# Patient Record
Sex: Female | Born: 2000 | Race: Black or African American | Hispanic: No | Marital: Single | Smoking: Never smoker
Health system: Southern US, Community
[De-identification: ages and names within clinical notes are randomized; demographics above are authoritative.]

## PROBLEM LIST (undated history)

## (undated) ENCOUNTER — Emergency Department (HOSPITAL_COMMUNITY): Admission: EM | Payer: Medicaid Other | Source: Home / Self Care

## (undated) ENCOUNTER — Inpatient Hospital Stay (HOSPITAL_COMMUNITY): Payer: Self-pay

## (undated) DIAGNOSIS — J353 Hypertrophy of tonsils with hypertrophy of adenoids: Secondary | ICD-10-CM

## (undated) DIAGNOSIS — E669 Obesity, unspecified: Secondary | ICD-10-CM

## (undated) DIAGNOSIS — G4733 Obstructive sleep apnea (adult) (pediatric): Secondary | ICD-10-CM

## (undated) DIAGNOSIS — F909 Attention-deficit hyperactivity disorder, unspecified type: Secondary | ICD-10-CM

## (undated) DIAGNOSIS — R0683 Snoring: Secondary | ICD-10-CM

## (undated) HISTORY — PX: TONSILLECTOMY: SUR1361

---
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
## 2023-03-21 NOTE — L&D Delivery Note (Addendum)
 OB/GYN Faculty Practice Delivery Note  Wendy Harrington is a 23 y.o. G1P0 s/p SVD at [redacted]w[redacted]d. She was admitted for SROM.    ROM: 22h 73m with clear fluid GBS Status: Positive/-- (08/12 1138), treated with PCN x 4 doses Maximum Maternal Temperature: 98.55F   Labor Progress: Initial SVE: 2/90/-1. Her labor was augmented with Pitocin . She then progressed to complete.   Delivery Date/Time: 11/12/23 0223 Delivery: Called to room and patient was complete and pushing. Head delivered LOP. Despite adequate maternal effort, the anterior shoulder was not delivering with typical traction for delivery. No excessive traction was used to facilitate delivery of infant. I attempted to deliver anterior shoulder with axillary sling, however was unable to given maternal soft tissue, which I think was ultimately the cause of difficult delivery. A shoulder dystocia was called -- called to have extra nursing team and NICU team in room for delivery, and make OR staff aware. The head of the bed was laid flat. Nursing team assisted in flexing both knees to [place]. I then attempted anterior axillary sling again and was able to successfully deliver anterior shoulder. Remainder of body delivered easily with single maternal effort. Episiotomy NOT required for delivery. Infant dried and stimulated and was initially cyanotic with poor tone so cord clamped x 2 and cut by father of baby at ~15 seconds. With further stimulation, infant vigorous with good cry. Arterial cord gas was sent and is pending. Cord blood drawn. Placenta delivered spontaneously with gentle cord traction. Fundus firm with massage and Pitocin . Labia, perineum, vagina, and cervix inspected inspected with no lacerations noted.  Baby Weight: 2892g  Placenta: Sent to L&D Complications: Shoulder dystocia, though this was moreso maternal soft tissue dystocia -- total time <1 min Lacerations: None EBL: 115 mL Analgesia: Epidural   Infant:  APGAR (1 MIN): 7   APGAR (5 MINS):  9   Alain Sor, MD OB Fellow, Faculty Practice [facility], Center for [facility]

## 2023-03-27 ENCOUNTER — Inpatient Hospital Stay (HOSPITAL_COMMUNITY)
Admission: AD | Admit: 2023-03-27 | Discharge: 2023-03-27 | Disposition: A | Discharge status: Home / Self Care | Payer: Medicaid Other | Attending: Obstetrics & Gynecology | Admitting: Obstetrics & Gynecology

## 2023-03-27 ENCOUNTER — Other Ambulatory Visit: Payer: Self-pay | Admitting: Family Medicine

## 2023-03-27 ENCOUNTER — Encounter (HOSPITAL_COMMUNITY): Payer: Self-pay | Admitting: *Deleted

## 2023-03-27 DIAGNOSIS — N926 Irregular menstruation, unspecified: Secondary | ICD-10-CM | POA: Insufficient documentation

## 2023-03-27 NOTE — Discharge Instructions (Signed)
 Safe Medications in Pregnancy   Acne: Benzoyl Peroxide Salicylic Acid  Backache/Headache: Tylenol: 2 regular strength every 4 hours OR              2 Extra strength every 6 hours  Colds/Coughs/Allergies: Benadryl (alcohol free) 25 mg every 6 hours as needed Breath right strips Claritin Cepacol throat lozenges Chloraseptic throat spray Cold-Eeze- up to three times per day Cough drops, alcohol free Flonase (by prescription only) Guaifenesin Mucinex Robitussin DM (plain only, alcohol free) Saline nasal spray/drops Sudafed (pseudoephedrine) & Actifed ** use only after [redacted] weeks gestation and if you do not have high blood pressure Tylenol Vicks Vaporub Zinc lozenges Zyrtec   Constipation: Colace Ducolax suppositories Fleet enema Glycerin suppositories Metamucil Milk of magnesia Miralax Senokot Smooth move tea  Diarrhea: Kaopectate Imodium A-D  *NO pepto Bismol  Hemorrhoids: Anusol Anusol HC Preparation H Tucks  Indigestion: Tums Maalox Mylanta Cimetidine (Tagamet HB)** preferred in pregnancy Famotidine (Pepcid) Ranitidine (Zantac)  Insomnia: Benadryl (alcohol free) 25mg  every 6 hours as needed Tylenol PM Unisom, no Gelcaps  Leg Cramps: Tums MagGel  Nausea/Vomiting:  Bonine Dramamine Emetrol Ginger extract Sea bands Meclizine   Nausea medication to take during pregnancy:  Unisom (doxylamine succinate 25 mg tablets) Take one tablet daily at bedtime. If symptoms are not adequately controlled, the dose can be increased to a maximum recommended dose of two tablets daily (1/2 tablet in the morning, 1/2 tablet mid-afternoon and one at bedtime). Vitamin B6 100mg  tablets. Take one tablet twice a day (up to 200 mg per day).  Skin Rashes: Aveeno products Benadryl cream or 25mg  every 6 hours as needed Calamine Lotion 1% cortisone cream  Yeast infection: Gyne-lotrimin 7 Monistat 7   **If taking multiple medications, please check labels to avoid  duplicating the same active ingredients **take medication as directed on the label ** Do not exceed 4000 mg of tylenol in 24 hours **Do not take medications that contain aspirin or ibuprofen

## 2023-03-27 NOTE — MAU Note (Addendum)
 Wendy Harrington is a 23 y.o. at Unknown here in MAU reporting: +HPT last Wed and on the 27th.  Both were positive. No pain or bleeding.  LMP: 11/16 Onset of complaint: missed cycle Pain score: none Vitals:   03/27/23 1423  BP: 137/78  Pulse: 67  Resp: 18  Temp: 98 F (36.7 C)  SpO2: 100%      Lab orders placed from triage:    No complaints, wanting comfirmation

## 2023-03-28 NOTE — MAU Provider Note (Signed)
 Event Date/Time   First Provider Initiated Contact with Patient 03/27/23 1433      S Ms. Wendy Harrington is a 23 y.o. No obstetric history on file. patient who presents to MAU today requesting confirmation of pregnancy. No acute questions or complaints. Interested in establishing prenatal care.   O BP 137/78 (BP Location: Right Arm)   Pulse 67   Temp 98 F (36.7 C) (Oral)   Resp 18   Ht 5' 3 (1.6 m)   Wt 135.8 kg   LMP 02/03/2023   SpO2 100%   BMI 53.02 kg/m  Physical Exam Constitutional:      General: She is not in acute distress.    Appearance: Normal appearance. She is not ill-appearing.  HENT:     Head: Normocephalic and atraumatic.  Cardiovascular:     Rate and Rhythm: Normal rate.  Pulmonary:     Effort: Pulmonary effort is normal.     Breath sounds: Normal breath sounds.  Abdominal:     Palpations: Abdomen is soft.     Tenderness: There is no abdominal tenderness. There is no guarding.  Musculoskeletal:        General: Normal range of motion.  Skin:    General: Skin is warm and dry.     Findings: No rash.  Neurological:     General: No focal deficit present.     Mental Status: She is alert and oriented to person, place, and time.     A Medical screening exam complete Missed period, no complaints, positive home UPT  P Given information for [facility], [facility], and [facility] who would be able to provide confirmation of pregnancy for pt Given list of OB groups in the area Given list of medications safe in pregnancy Given MAU return precautions Discharge from MAU in stable condition  Von Reasoner, MD 03/28/2023 10:16 AM

## 2023-05-03 ENCOUNTER — Encounter (HOSPITAL_BASED_OUTPATIENT_CLINIC_OR_DEPARTMENT_OTHER): Payer: Self-pay | Admitting: *Deleted

## 2023-05-08 ENCOUNTER — Other Ambulatory Visit (HOSPITAL_COMMUNITY)
Admission: RE | Admit: 2023-05-08 | Discharge: 2023-05-08 | Disposition: A | Discharge status: Home / Self Care | Payer: Medicaid Other | Source: Ambulatory Visit | Attending: Certified Nurse Midwife | Admitting: Certified Nurse Midwife

## 2023-05-08 ENCOUNTER — Encounter (HOSPITAL_BASED_OUTPATIENT_CLINIC_OR_DEPARTMENT_OTHER): Payer: Self-pay | Admitting: Certified Nurse Midwife

## 2023-05-08 ENCOUNTER — Ambulatory Visit (HOSPITAL_BASED_OUTPATIENT_CLINIC_OR_DEPARTMENT_OTHER): Payer: Medicaid Other

## 2023-05-08 ENCOUNTER — Ambulatory Visit (HOSPITAL_BASED_OUTPATIENT_CLINIC_OR_DEPARTMENT_OTHER): Payer: Medicaid Other | Admitting: Certified Nurse Midwife

## 2023-05-08 ENCOUNTER — Encounter (HOSPITAL_BASED_OUTPATIENT_CLINIC_OR_DEPARTMENT_OTHER): Payer: Self-pay

## 2023-05-08 VITALS — BP 128/84

## 2023-05-08 VITALS — BP 126/91 | HR 75 | Ht 62.7 in | Wt 297.0 lb

## 2023-05-08 DIAGNOSIS — O99210 Obesity complicating pregnancy, unspecified trimester: Secondary | ICD-10-CM | POA: Diagnosis not present

## 2023-05-08 DIAGNOSIS — O0990 Supervision of high risk pregnancy, unspecified, unspecified trimester: Secondary | ICD-10-CM

## 2023-05-08 DIAGNOSIS — O99320 Drug use complicating pregnancy, unspecified trimester: Secondary | ICD-10-CM

## 2023-05-08 DIAGNOSIS — T7432XS Child psychological abuse, confirmed, sequela: Secondary | ICD-10-CM

## 2023-05-08 DIAGNOSIS — Z3A13 13 weeks gestation of pregnancy: Secondary | ICD-10-CM

## 2023-05-08 DIAGNOSIS — O0991 Supervision of high risk pregnancy, unspecified, first trimester: Secondary | ICD-10-CM

## 2023-05-08 DIAGNOSIS — F32A Depression, unspecified: Secondary | ICD-10-CM

## 2023-05-08 DIAGNOSIS — T7622XS Child sexual abuse, suspected, sequela: Secondary | ICD-10-CM

## 2023-05-08 DIAGNOSIS — Z23 Encounter for immunization: Secondary | ICD-10-CM | POA: Diagnosis not present

## 2023-05-08 DIAGNOSIS — T7622XA Child sexual abuse, suspected, initial encounter: Secondary | ICD-10-CM | POA: Insufficient documentation

## 2023-05-08 DIAGNOSIS — R030 Elevated blood-pressure reading, without diagnosis of hypertension: Secondary | ICD-10-CM

## 2023-05-08 DIAGNOSIS — T7432XA Child psychological abuse, confirmed, initial encounter: Secondary | ICD-10-CM | POA: Insufficient documentation

## 2023-05-08 DIAGNOSIS — Z6841 Body Mass Index (BMI) 40.0 and over, adult: Secondary | ICD-10-CM

## 2023-05-08 DIAGNOSIS — F1290 Cannabis use, unspecified, uncomplicated: Secondary | ICD-10-CM

## 2023-05-08 DIAGNOSIS — O98211 Gonorrhea complicating pregnancy, first trimester: Secondary | ICD-10-CM

## 2023-05-08 LAB — CERVICOVAGINAL ANCILLARY ONLY
Chlamydia: NEGATIVE
Comment: NEGATIVE
Comment: NORMAL
Neisseria Gonorrhea: POSITIVE — AB

## 2023-05-08 MED ORDER — BLOOD PRESSURE KIT DEVI: 1.0000 | Freq: Once | 0 refills | Status: AC

## 2023-05-08 MED ORDER — ASPIRIN 81 MG PO CAPS
1.0000 | ORAL_CAPSULE | Freq: Every day | ORAL | 12 refills | Status: DC
Start: 2023-05-08 — End: 2023-11-11

## 2023-05-08 MED ORDER — GOJJI WEIGHT SCALE MISC: 0 refills | Status: DC

## 2023-05-08 MED ORDER — PRENAISSANCE PLUS 28-1-250 MG PO CAPS: 1.0000 | ORAL_CAPSULE | Freq: Every day | ORAL | 6 refills | Status: AC

## 2023-05-08 NOTE — Progress Notes (Unsigned)
New OB Intake  I explained I am completing New OB Intake today. We discussed EDD of Not found.. Pt is G1P0. I reviewed her allergies, medications and Medical/Surgical/OB history.    Patient Active Problem List   Diagnosis Date Noted   Supervision of high risk pregnancy, antepartum 05/08/2023   Severe obesity (BMI >= 40) (HCC) 07/09/2012   ADHD (attention deficit hyperactivity disorder) 07/09/2012    Concerns addressed today  Delivery Plans Plans to deliver at [facility]. Discussed the nature of our practice with multiple providers including residents and students. Due to the size of the practice, the delivering provider may not be the same as those providing prenatal care.   Patient is not interested in water birth.   MyChart/Babyscripts MyChart access verified. I explained pt will have some visits in office and some virtually. Babyscripts instructions given and order placed. .   Blood Pressure Cuff/Weight Scale Blood pressure cuff ordered for patient to pick-up from [employer]. Explained after first prenatal appt pt will check weekly and document in Babyscripts. Patient does not have weight scale; order sent to [facility], patient may track weight weekly in Babyscripts.  Anatomy US Explained first scheduled [place] will be around 19 weeks. Anatomy US scheduled for 06/26/23 at 0915.  Is patient a CenteringPregnancy candidate?  Declined Declined due to Declined to say  Interested in [place]?no  Is patient a candidate for Babyscripts Optimization? Yes, patient declined   First visit review I reviewed new OB appt with patient. Explained pt will be seen by Merrilee Jansky, CNM at first visit. Discussed Avelina Laine genetic screening with patient. PT would like both Panorama and Horizon and would like to know gender. Routine prenatal labs  needed today    Last Pap N/a due to age  Harrie Jeans, RN 05/08/2023  11:12 AM

## 2023-05-09 ENCOUNTER — Telehealth (HOSPITAL_BASED_OUTPATIENT_CLINIC_OR_DEPARTMENT_OTHER): Payer: Self-pay | Admitting: Certified Nurse Midwife

## 2023-05-09 ENCOUNTER — Encounter (HOSPITAL_BASED_OUTPATIENT_CLINIC_OR_DEPARTMENT_OTHER): Payer: Self-pay | Admitting: Certified Nurse Midwife

## 2023-05-09 DIAGNOSIS — A549 Gonococcal infection, unspecified: Secondary | ICD-10-CM

## 2023-05-09 DIAGNOSIS — O98211 Gonorrhea complicating pregnancy, first trimester: Secondary | ICD-10-CM | POA: Insufficient documentation

## 2023-05-09 DIAGNOSIS — R030 Elevated blood-pressure reading, without diagnosis of hypertension: Secondary | ICD-10-CM | POA: Insufficient documentation

## 2023-05-09 LAB — RPR: RPR Ser Ql: NONREACTIVE

## 2023-05-09 LAB — CBC
Hematocrit: 40.1 % (ref 34.0–46.6)
Hemoglobin: 13 g/dL (ref 11.1–15.9)
MCH: 28.4 pg (ref 26.6–33.0)
MCHC: 32.4 g/dL (ref 31.5–35.7)
MCV: 88 fL (ref 79–97)
Platelets: 460 10*3/uL — ABNORMAL HIGH (ref 150–450)
RBC: 4.57 x10E6/uL (ref 3.77–5.28)
RDW: 14.1 % (ref 11.7–15.4)
WBC: 11.8 10*3/uL — ABNORMAL HIGH (ref 3.4–10.8)

## 2023-05-09 LAB — HEPATITIS C ANTIBODY: Hep C Virus Ab: NONREACTIVE

## 2023-05-09 LAB — ABO/RH: Rh Factor: POSITIVE

## 2023-05-09 LAB — ANTIBODY SCREEN: Antibody Screen: NEGATIVE

## 2023-05-09 LAB — RUBELLA SCREEN: Rubella Antibodies, IGG: 6.25 {index} (ref >0.99)

## 2023-05-09 LAB — URINE CULTURE, OB REFLEX

## 2023-05-09 LAB — HEMOGLOBIN A1C
Est. average glucose Bld gHb Est-mCnc: <74 mg/dL
Hgb A1c MFr Bld: <4.2 % — ABNORMAL LOW (ref 4.8–5.6)

## 2023-05-09 LAB — CULTURE, OB URINE

## 2023-05-09 LAB — HIV ANTIBODY (ROUTINE TESTING W REFLEX): HIV Screen 4th Generation wRfx: NONREACTIVE

## 2023-05-09 LAB — HEPATITIS B SURFACE ANTIGEN: Hepatitis B Surface Ag: NEGATIVE

## 2023-05-09 MED ORDER — CEFTRIAXONE SODIUM 500 MG IJ SOLR
500.0000 mg | Freq: Once | INTRAMUSCULAR | Status: AC
  Administered 2023-05-14: 500 mg via INTRAMUSCULAR

## 2023-05-09 MED ORDER — CEFTRIAXONE SODIUM 250 MG IJ SOLR
250.0000 mg | Freq: Once | INTRAMUSCULAR | Status: DC
Start: 2023-05-09 — End: 2023-05-09
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
## 2023-05-09 NOTE — Telephone Encounter (Signed)
Tried to telephone patient to inform her of +Gonorrhea and need for treatment. An individual answered the phone and responded "wrong number" and hung up (female). No answer at patient's Mother's telephone number.  Letta Kocher

## 2023-05-14 ENCOUNTER — Encounter (HOSPITAL_BASED_OUTPATIENT_CLINIC_OR_DEPARTMENT_OTHER): Payer: Self-pay

## 2023-05-14 ENCOUNTER — Ambulatory Visit (HOSPITAL_BASED_OUTPATIENT_CLINIC_OR_DEPARTMENT_OTHER): Payer: Medicaid Other

## 2023-05-14 VITALS — BP 130/80 | HR 79 | Ht 62.5 in | Wt 302.4 lb

## 2023-05-14 DIAGNOSIS — Z3A14 14 weeks gestation of pregnancy: Secondary | ICD-10-CM

## 2023-05-14 DIAGNOSIS — O98211 Gonorrhea complicating pregnancy, first trimester: Secondary | ICD-10-CM | POA: Diagnosis not present

## 2023-05-14 DIAGNOSIS — A549 Gonococcal infection, unspecified: Secondary | ICD-10-CM

## 2023-05-14 NOTE — Progress Notes (Signed)
 Patient came in today to receive rocephin injection per Merrilee Jansky. Patient was given injection. Patient tolerated injection well. tbw

## 2023-05-16 LAB — PANORAMA PRENATAL TEST FULL PANEL:PANORAMA TEST PLUS 5 ADDITIONAL MICRODELETIONS: FETAL FRACTION: 3

## 2023-05-19 LAB — HORIZON CUSTOM: REPORT SUMMARY: POSITIVE — AB

## 2023-05-21 ENCOUNTER — Encounter (HOSPITAL_BASED_OUTPATIENT_CLINIC_OR_DEPARTMENT_OTHER): Payer: Self-pay | Admitting: *Deleted

## 2023-05-21 DIAGNOSIS — D563 Thalassemia minor: Secondary | ICD-10-CM | POA: Insufficient documentation

## 2023-06-05 ENCOUNTER — Ambulatory Visit (INDEPENDENT_AMBULATORY_CARE_PROVIDER_SITE_OTHER): Payer: Medicaid Other | Admitting: Obstetrics & Gynecology

## 2023-06-05 ENCOUNTER — Other Ambulatory Visit (HOSPITAL_COMMUNITY)
Admission: RE | Admit: 2023-06-05 | Discharge: 2023-06-05 | Disposition: A | Discharge status: Home / Self Care | Source: Ambulatory Visit | Attending: Obstetrics & Gynecology | Admitting: Obstetrics & Gynecology

## 2023-06-05 VITALS — BP 116/82 | HR 85 | Wt 312.4 lb

## 2023-06-05 DIAGNOSIS — O0990 Supervision of high risk pregnancy, unspecified, unspecified trimester: Secondary | ICD-10-CM

## 2023-06-05 DIAGNOSIS — O99210 Obesity complicating pregnancy, unspecified trimester: Secondary | ICD-10-CM | POA: Diagnosis not present

## 2023-06-05 DIAGNOSIS — Z6841 Body Mass Index (BMI) 40.0 and over, adult: Secondary | ICD-10-CM | POA: Diagnosis not present

## 2023-06-05 DIAGNOSIS — Z3A17 17 weeks gestation of pregnancy: Secondary | ICD-10-CM

## 2023-06-05 DIAGNOSIS — Z8619 Personal history of other infectious and parasitic diseases: Secondary | ICD-10-CM

## 2023-06-05 DIAGNOSIS — D563 Thalassemia minor: Secondary | ICD-10-CM

## 2023-06-05 DIAGNOSIS — O98211 Gonorrhea complicating pregnancy, first trimester: Secondary | ICD-10-CM

## 2023-06-06 LAB — CERVICOVAGINAL ANCILLARY ONLY
Chlamydia: NEGATIVE
Comment: NEGATIVE
Comment: NORMAL
Neisseria Gonorrhea: POSITIVE — AB

## 2023-06-07 LAB — AFP, SERUM, OPEN SPINA BIFIDA
AFP MoM: 0.92
AFP Value: 26.4 ng/mL
Gest. Age on Collection Date: 17.3 wk
Maternal Age At EDD: 21.2 a
OSBR Risk 1 IN: 10000
Test Results:: NEGATIVE
Weight: 312 [lb_av]

## 2023-06-07 NOTE — Progress Notes (Signed)
   PRENATAL VISIT NOTE  Subjective:  Wendy Harrington is a 23 y.o. G1P0 at [redacted]w[redacted]d being seen today for ongoing prenatal care.  She is currently monitored for the following issues for this high-risk pregnancy and has ADHD (attention deficit hyperactivity disorder); Supervision of high risk pregnancy, antepartum; BMI 50.0-59.9, adult (HCC); Maternal morbid obesity, antepartum (HCC); Depression; Maternal gonorrhea in first trimester; Elevated blood pressure reading; and Alpha thalassemia silent carrier on their problem list.  Patient reports no complaints.  Her mother accompanies her today.  Contractions: Not present. Vag. Bleeding: None.  Movement: Absent. Denies leaking of fluid.   The following portions of the patient's history were reviewed and updated as appropriate: allergies, current medications, past family history, past medical history, past social history, past surgical history and problem list.   Objective:   Vitals:   06/05/23 1052  BP: 116/82  Pulse: 85  Weight: (!) 312 lb 6.4 oz (141.7 kg)    Fetal Status: Fetal Heart Rate (bpm): 145   Movement: Absent     General:  Alert, oriented and cooperative. Patient is in no acute distress.  Skin: Skin is warm and dry. No rash noted.   Cardiovascular: Normal heart rate noted  Respiratory: Normal respiratory effort, no problems with respiration noted  Abdomen: Soft, gravid, appropriate for gestational age.  Pain/Pressure: Present     Pelvic: Cervical exam deferred        Extremities: Normal range of motion.  Edema: None  Mental Status: Normal mood and affect. Normal behavior. Normal judgment and thought content.   Assessment and Plan:  Pregnancy: G1P0 at [redacted]w[redacted]d 1. Supervision of high risk pregnancy, antepartum (Primary) - on PNV and baby ASA - Anatomy scan scheduled - recheck 4 weeks  2. [redacted] weeks gestation of pregnancy - AFP, Serum, Open Spina Bifida  3. Alpha thalassemia silent carrier - discussed this result and testing FOB.   She will discus with him.  4. Maternal morbid obesity, antepartum (HCC) - growth scans and additional monitoring towards end of pregnancy discussed  5. BMI 50.0-59.9, adult (HCC)  6. Maternal gonorrhea in first trimester - repeat testing obtained today   Preterm labor symptoms and general obstetric precautions including but not limited to vaginal bleeding, contractions, leaking of fluid and fetal movement were reviewed in detail with the patient. Please refer to After Visit Summary for other counseling recommendations.   Return in about 4 weeks (around 07/03/2023).  Future Appointments  Date Time Provider Department Center  06/08/2023  9:30 AM [facility] NURSE [facility]  06/26/2023  9:15 AM [facility] NURSE [facility]  06/26/2023  9:30 AM [facility] US4 [facility]  06/26/2023 10:00 AM [facility] PROVIDER 1 [facility]  07/03/2023  9:55 AM Lo, Toma Aran, CNM [facility]  07/24/2023 10:55 AM Lo, Toma Aran, CNM [facility]  08/14/2023  8:15 AM Lo, Toma Aran, CNM [facility]  09/04/2023 10:35 AM Lo, Toma Aran, CNM [facility]  09/18/2023 10:15 AM Jerene Bears, MD [facility]  10/02/2023 10:15 AM Jerene Bears, MD [facility]  10/16/2023 10:15 AM Marton Redwood, Toma Aran, CNM [facility]  10/23/2023 10:15 AM Jerene Bears, MD [facility]  10/30/2023 10:15 AM Jerene Bears, MD [facility]  11/06/2023 10:15 AM Jerene Bears, MD [facility]  11/13/2023 10:15 AM Lo, Toma Aran, CNM [facility]    Jerene Bears, MD

## 2023-06-08 ENCOUNTER — Ambulatory Visit (HOSPITAL_BASED_OUTPATIENT_CLINIC_OR_DEPARTMENT_OTHER)

## 2023-06-11 ENCOUNTER — Ambulatory Visit (HOSPITAL_BASED_OUTPATIENT_CLINIC_OR_DEPARTMENT_OTHER): Admitting: *Deleted

## 2023-06-11 VITALS — BP 116/76 | HR 73 | Wt 315.0 lb

## 2023-06-11 DIAGNOSIS — O0992 Supervision of high risk pregnancy, unspecified, second trimester: Secondary | ICD-10-CM

## 2023-06-11 DIAGNOSIS — O0990 Supervision of high risk pregnancy, unspecified, unspecified trimester: Secondary | ICD-10-CM

## 2023-06-11 DIAGNOSIS — A549 Gonococcal infection, unspecified: Secondary | ICD-10-CM

## 2023-06-11 DIAGNOSIS — O98212 Gonorrhea complicating pregnancy, second trimester: Secondary | ICD-10-CM

## 2023-06-11 DIAGNOSIS — Z3A18 18 weeks gestation of pregnancy: Secondary | ICD-10-CM | POA: Diagnosis not present

## 2023-06-11 DIAGNOSIS — D563 Thalassemia minor: Secondary | ICD-10-CM

## 2023-06-11 MED ORDER — CEFTRIAXONE SODIUM 500 MG IJ SOLR
500.0000 mg | Freq: Once | INTRAMUSCULAR | Status: AC
  Administered 2023-06-11: 500 mg via INTRAMUSCULAR

## 2023-06-11 NOTE — Progress Notes (Signed)
 NURSE VISIT- INJECTION  SUBJECTIVE:  Wendy Harrington is a 23 y.o. G1P0 female here for a Rocephin for treatment for gonorrhea. She is [redacted]w[redacted]d pregnant.   OBJECTIVE:  BP 116/76   Pulse 73   Wt (!) 315 lb (142.9 kg)   LMP 02/03/2023   BMI 56.70 kg/m   Appears well, in no apparent distress  Injection administered in: Left upper quad. gluteus  Meds ordered this encounter  Medications   cefTRIAXone (ROCEPHIN) injection 500 mg    ASSESSMENT: Pregnancy [redacted]w[redacted]d Rocephin for treatment for gonorrhea PLAN: Follow-up: in 4 weeks for proof of cure

## 2023-06-22 ENCOUNTER — Ambulatory Visit: Attending: Certified Nurse Midwife | Admitting: *Deleted

## 2023-06-22 DIAGNOSIS — Z3689 Encounter for other specified antenatal screening: Secondary | ICD-10-CM

## 2023-06-22 DIAGNOSIS — Z3A19 19 weeks gestation of pregnancy: Secondary | ICD-10-CM

## 2023-06-22 NOTE — Progress Notes (Signed)
 New OB Intake  I connected with Wendy Harrington  on 06/22/23 at 0815 by phone and verified that I am speaking with the correct person using two identifiers. Nurse is located at [facility] and pt is located at home.   I explained I am completing New Patient Intake today. We discussed EDD of 11/10/2023, by Last Menstrual Period. Pt is G1P0. I reviewed her allergies, medications and Medical/Surgical/OB history.    Patient Active Problem List   Diagnosis Date Noted   Alpha thalassemia silent carrier 05/21/2023   Maternal gonorrhea in first trimester 05/09/2023   Elevated blood pressure reading 05/09/2023   Supervision of high risk pregnancy, antepartum 05/08/2023   BMI 50.0-59.9, adult (HCC) 05/08/2023   Maternal morbid obesity, antepartum (HCC) 05/08/2023   Depression 05/08/2023   ADHD (attention deficit hyperactivity disorder) 07/09/2012    Patient advised of first appointment in our office and when to arrive.   All questions were answered.

## 2023-06-26 ENCOUNTER — Ambulatory Visit: Payer: Medicaid Other | Attending: Certified Nurse Midwife

## 2023-06-26 ENCOUNTER — Other Ambulatory Visit: Payer: Self-pay | Admitting: *Deleted

## 2023-06-26 ENCOUNTER — Ambulatory Visit: Payer: Medicaid Other

## 2023-06-26 ENCOUNTER — Ambulatory Visit (HOSPITAL_BASED_OUTPATIENT_CLINIC_OR_DEPARTMENT_OTHER): Payer: Medicaid Other | Admitting: Obstetrics and Gynecology

## 2023-06-26 VITALS — BP 124/74 | HR 80

## 2023-06-26 DIAGNOSIS — Z3A20 20 weeks gestation of pregnancy: Secondary | ICD-10-CM

## 2023-06-26 DIAGNOSIS — O99012 Anemia complicating pregnancy, second trimester: Secondary | ICD-10-CM

## 2023-06-26 DIAGNOSIS — E669 Obesity, unspecified: Secondary | ICD-10-CM | POA: Diagnosis not present

## 2023-06-26 DIAGNOSIS — O0990 Supervision of high risk pregnancy, unspecified, unspecified trimester: Secondary | ICD-10-CM | POA: Insufficient documentation

## 2023-06-26 DIAGNOSIS — Z3A18 18 weeks gestation of pregnancy: Secondary | ICD-10-CM

## 2023-06-26 DIAGNOSIS — O99212 Obesity complicating pregnancy, second trimester: Secondary | ICD-10-CM

## 2023-06-26 DIAGNOSIS — D563 Thalassemia minor: Secondary | ICD-10-CM

## 2023-06-26 DIAGNOSIS — O0991 Supervision of high risk pregnancy, unspecified, first trimester: Secondary | ICD-10-CM | POA: Diagnosis present

## 2023-06-26 NOTE — Progress Notes (Signed)
 Maternal-Fetal Medicine Consultation Name: Wendy Harrington, Wendy Harrington MRN: [id number]  G1 P0. Uncertain dates. Patient is here for fetal anatomy scan.  Problems include: -Pregravid BMI 53.1 -Silent carrier for alpha thalassemia.  On cell-free fetal DNA screening, the risks of fetal aneuploidies are not increased.  MSAFP screening showed low risk for open neural tube defects.  Ultrasound We performed fetal anatomical survey.  Fetal biometry is consistent with 18 weeks and 6 days gestation.  Amniotic fluid is normal and good fetal activity seen.  No markers of aneuploidies or obvious fetal structural defects are seen. Based on today's ultrasound measurements we have assigned her EDD at 11/21/2023.  As maternal obesity imposes limitations on the resolution of images, fetal anomalies may be missed.  Patient understands the limitations of ultrasound in detecting fetal anomalies.  Grade 3 obesity I discussed our ultrasound protocol of serial fetal growth assessments.  Grade 3 obesity is associated with a slight increase in stillbirth (2.5-to 3-fold).  However, the absolute risk is very small.  I discussed weekly antenatal testing that will begin at [redacted] weeks gestation till delivery.  Alpha Thalassemia-Silent Carrier I discussed the genetics and autosomal recessive mode of inheritance.  Briefly discussed the likelihood of variants of alpha thalassemia based on her partner's carrier status.  The pregnancy is not at risk if her partner is a silent carrier.  However, if her partner is a carrier in cis configuration (--/aa), there is a 25% chance for hemoglobin H disease.  I encouraged the patient to have her partner screened. Recommendations -An appointment was made for her to return in 4 weeks and 8 weeks for fetal growth assessment. -Fetal growth assessments every 4 weeks. -Weekly BPP/NST from [redacted] weeks gestation till delivery. -Partner screening for alpha thalassemia. Consultation including face-to-face (more  than 50%) counseling 30 minutes.

## 2023-06-28 NOTE — Addendum Note (Signed)
 Addended byMerrilee Jansky on: 06/28/2023 11:35 AM   Modules accepted: Orders, Level of Service

## 2023-07-03 ENCOUNTER — Ambulatory Visit (HOSPITAL_BASED_OUTPATIENT_CLINIC_OR_DEPARTMENT_OTHER): Payer: Medicaid Other | Admitting: Certified Nurse Midwife

## 2023-07-03 VITALS — BP 115/73 | HR 85 | Wt 320.8 lb

## 2023-07-03 DIAGNOSIS — O99212 Obesity complicating pregnancy, second trimester: Secondary | ICD-10-CM

## 2023-07-03 DIAGNOSIS — O0990 Supervision of high risk pregnancy, unspecified, unspecified trimester: Secondary | ICD-10-CM

## 2023-07-03 DIAGNOSIS — O0992 Supervision of high risk pregnancy, unspecified, second trimester: Secondary | ICD-10-CM

## 2023-07-03 DIAGNOSIS — Z3A19 19 weeks gestation of pregnancy: Secondary | ICD-10-CM | POA: Diagnosis not present
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
## 2023-07-24 ENCOUNTER — Ambulatory Visit (HOSPITAL_BASED_OUTPATIENT_CLINIC_OR_DEPARTMENT_OTHER): Payer: Medicaid Other | Admitting: Certified Nurse Midwife

## 2023-07-24 ENCOUNTER — Encounter (HOSPITAL_BASED_OUTPATIENT_CLINIC_OR_DEPARTMENT_OTHER): Payer: Self-pay | Admitting: Certified Nurse Midwife

## 2023-07-24 VITALS — BP 127/73 | HR 70 | Wt 332.8 lb

## 2023-07-24 DIAGNOSIS — Z3A22 22 weeks gestation of pregnancy: Secondary | ICD-10-CM | POA: Diagnosis not present

## 2023-07-24 DIAGNOSIS — O99210 Obesity complicating pregnancy, unspecified trimester: Secondary | ICD-10-CM | POA: Diagnosis not present

## 2023-07-24 NOTE — Addendum Note (Signed)
 Addended byHezzie Loupe on: 07/24/2023 01:37 PM   Modules accepted: Orders
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
## 2023-07-27 ENCOUNTER — Ambulatory Visit

## 2023-08-09 ENCOUNTER — Ambulatory Visit

## 2023-08-14 ENCOUNTER — Ambulatory Visit (HOSPITAL_BASED_OUTPATIENT_CLINIC_OR_DEPARTMENT_OTHER): Payer: Medicaid Other | Admitting: Certified Nurse Midwife

## 2023-08-14 ENCOUNTER — Other Ambulatory Visit (HOSPITAL_COMMUNITY)
Admission: RE | Admit: 2023-08-14 | Discharge: 2023-08-14 | Disposition: A | Discharge status: Home / Self Care | Source: Ambulatory Visit | Attending: Certified Nurse Midwife | Admitting: Certified Nurse Midwife

## 2023-08-14 VITALS — BP 124/87 | HR 85 | Wt 338.4 lb

## 2023-08-14 DIAGNOSIS — Z3A25 25 weeks gestation of pregnancy: Secondary | ICD-10-CM

## 2023-08-14 DIAGNOSIS — O98212 Gonorrhea complicating pregnancy, second trimester: Secondary | ICD-10-CM

## 2023-08-14 DIAGNOSIS — O98211 Gonorrhea complicating pregnancy, first trimester: Secondary | ICD-10-CM | POA: Diagnosis present

## 2023-08-14 DIAGNOSIS — O99212 Obesity complicating pregnancy, second trimester: Secondary | ICD-10-CM

## 2023-08-14 DIAGNOSIS — O0990 Supervision of high risk pregnancy, unspecified, unspecified trimester: Secondary | ICD-10-CM

## 2023-08-14 DIAGNOSIS — Z6841 Body Mass Index (BMI) 40.0 and over, adult: Secondary | ICD-10-CM

## 2023-08-14 DIAGNOSIS — D563 Thalassemia minor: Secondary | ICD-10-CM

## 2023-08-14 DIAGNOSIS — O99112 Other diseases of the blood and blood-forming organs and certain disorders involving the immune mechanism complicating pregnancy, second trimester: Secondary | ICD-10-CM
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
## 2023-08-15 LAB — GC/CHLAMYDIA PROBE AMP (~~LOC~~) NOT AT ARMC
Chlamydia: NEGATIVE
Comment: NEGATIVE
Comment: NORMAL
Neisseria Gonorrhea: NEGATIVE

## 2023-08-17 ENCOUNTER — Ambulatory Visit (HOSPITAL_BASED_OUTPATIENT_CLINIC_OR_DEPARTMENT_OTHER): Payer: Self-pay | Admitting: Certified Nurse Midwife

## 2023-08-20 ENCOUNTER — Other Ambulatory Visit: Payer: Self-pay

## 2023-08-21 ENCOUNTER — Encounter: Attending: Certified Nurse Midwife | Admitting: Dietician

## 2023-08-21 ENCOUNTER — Other Ambulatory Visit: Payer: Self-pay

## 2023-08-21 ENCOUNTER — Ambulatory Visit (INDEPENDENT_AMBULATORY_CARE_PROVIDER_SITE_OTHER): Admitting: Dietician

## 2023-08-21 DIAGNOSIS — E669 Obesity, unspecified: Secondary | ICD-10-CM

## 2023-08-21 DIAGNOSIS — O99212 Obesity complicating pregnancy, second trimester: Secondary | ICD-10-CM | POA: Insufficient documentation

## 2023-08-21 DIAGNOSIS — Z713 Dietary counseling and surveillance: Secondary | ICD-10-CM | POA: Diagnosis not present

## 2023-08-21 DIAGNOSIS — Z3A26 26 weeks gestation of pregnancy: Secondary | ICD-10-CM

## 2023-08-21 NOTE — Progress Notes (Unsigned)
 Medical Nutrition Therapy  Appointment Start time:  59-  Appointment End time:  1100  Primary concerns today: healthy eating, healthy pregnancy  Referral diagnosis: Morbid obesity in the second trimester of pregnancy Preferred learning style:  no preference indicated Learning readiness:  ready   NUTRITION ASSESSMENT  63" Wt Readings from Last 3 Encounters:  08/14/23 (!) 338 lb 6.4 oz (153.5 kg)  07/24/23 (!) 332 lb 12.8 oz (151 kg)  07/03/23 (!) 320 lb 12.8 oz (145.5 kg)  Unsure of prepregnancy weight  08/21/2023 gestation - [redacted]w[redacted]d  Clinical Medical Hx: OSA (not using C-pap) Medications: aspiring, 81 mg Labs: A1C 5.2 on 05/08/2023 Notable Signs/Symptoms: none  Lifestyle & Dietary Hx Patient lives with her grandmother.  She does the cooking and shopping. She is not employed and has an interview Thursday. She does get food stamps.  Estimated daily fluid intake: "a lot" Supplements: prenatal vitamin Sleep: Can't fall asleep until 6-7 am but takes a 1-4 hour nap at 6-7 pm most days. Stress / self-care: low Current average weekly physical activity: walks 30 minutes 3 times per week  24-Hr Dietary Recall First Meal: none today but usually cinnamon toast crunch cereal with 2% milk Snack: strawberries Second Meal: baked chicken, corn on the cob, mashed potatoes Snack: none Third Meal: PB & J sandwich on white bread Snack: hot chips Beverages: water, minute made mango juice occasionally  NUTRITION DIAGNOSIS  [place]-3.3 Overweight/obesity As related to balance of energy intake.  As evidenced by BMI >60.   NUTRITION INTERVENTION  Nutrition education (E-1) on the following topics:  My plate - creating a balanced plate, simple meal planning, resources for recipes/ideas WIC and benefits Benefits of exercise as permitted Beverage choice Emotional and distracted eating  Handouts Provided Include  Pregnancy Nutrition therapy from AND My plate  Learning Style & Readiness for  Change Teaching method utilized: Visual & Auditory  Demonstrated degree of understanding via: Teach Back  Barriers to learning/adherence to lifestyle change: none  Goals Established by Pt Eat your meals and snacks at the table.  Consider keeping your bedroom a food free zone. Water as your best beverage Keep up the walking.  Stay active most days.  Consider WIC My [employer]   Fruits & Vegetables: Aim to fill half your plate with a variety of fruits and vegetables. They are rich in vitamins, minerals, and fiber, and can help reduce the risk of chronic diseases. Choose a colorful assortment of fruits and vegetables to ensure you get a wide range of nutrients. Grains and Starches: Make at least half of your grain choices whole grains, such as brown rice, whole wheat bread, and oats. Whole grains provide fiber, which aids in digestion and healthy cholesterol levels. Aim for whole forms of starchy vegetables such as potatoes, sweet potatoes, beans, peas, and corn, which are fiber rich and provide many vitamins and minerals.  Protein: Incorporate lean sources of protein, such as poultry, fish, beans, nuts, and seeds, into your meals. Protein is essential for building and repairing tissues, staying full, balancing blood sugar, as well as supporting immune function. Dairy: Include low-fat or fat-free dairy products like milk, yogurt, and cheese in your diet. Dairy foods are excellent sources of calcium and vitamin D, which are crucial for bone health.  Physical Activity: Aim for 60 minutes of physical activity daily. Regular physical activity promotes overall health-including helping to reduce risk for heart disease and diabetes, promoting mental health, and helping us  sleep better   MONITORING & EVALUATION  Dietary intake, weekly physical activity prn  Next Steps  Patient is to call for questions or appointment.

## 2023-08-21 NOTE — Patient Instructions (Addendum)
 Eat your meals and snacks at the table.  Consider keeping your bedroom a food free zone. Water as your best beverage Keep up the walking.  Stay active most days.  Consider WIC [employer]     Fruits & Vegetables: Aim to fill half your plate with a variety of fruits and vegetables. They are rich in vitamins, minerals, and fiber, and can help reduce the risk of chronic diseases. Choose a colorful assortment of fruits and vegetables to ensure you get a wide range of nutrients. Grains and Starches: Make at least half of your grain choices whole grains, such as brown rice, whole wheat bread, and oats. Whole grains provide fiber, which aids in digestion and healthy cholesterol levels. Aim for whole forms of starchy vegetables such as potatoes, sweet potatoes, beans, peas, and corn, which are fiber rich and provide many vitamins and minerals.  Protein: Incorporate lean sources of protein, such as poultry, fish, beans, nuts, and seeds, into your meals. Protein is essential for building and repairing tissues, staying full, balancing blood sugar, as well as supporting immune function. Dairy: Include low-fat or fat-free dairy products like milk, yogurt, and cheese in your diet. Dairy foods are excellent sources of calcium and vitamin D, which are crucial for bone health.  Physical Activity: Aim for 60 minutes of physical activity daily. Regular physical activity promotes overall health-including helping to reduce risk for heart disease and diabetes, promoting mental health, and helping us  sleep better.

## 2023-08-24 ENCOUNTER — Ambulatory Visit: Admitting: Obstetrics

## 2023-08-24 ENCOUNTER — Ambulatory Visit: Attending: Obstetrics and Gynecology

## 2023-08-24 DIAGNOSIS — O99212 Obesity complicating pregnancy, second trimester: Secondary | ICD-10-CM

## 2023-08-24 DIAGNOSIS — E669 Obesity, unspecified: Secondary | ICD-10-CM

## 2023-08-24 DIAGNOSIS — Z3A17 17 weeks gestation of pregnancy: Secondary | ICD-10-CM

## 2023-08-24 DIAGNOSIS — O0990 Supervision of high risk pregnancy, unspecified, unspecified trimester: Secondary | ICD-10-CM

## 2023-08-24 DIAGNOSIS — Z3A27 27 weeks gestation of pregnancy: Secondary | ICD-10-CM | POA: Diagnosis not present

## 2023-08-24 DIAGNOSIS — Z148 Genetic carrier of other disease: Secondary | ICD-10-CM | POA: Diagnosis not present

## 2023-08-24 DIAGNOSIS — O0992 Supervision of high risk pregnancy, unspecified, second trimester: Secondary | ICD-10-CM

## 2023-08-24 NOTE — Progress Notes (Signed)
 MFM Consult Note  Wendy Harrington is currently at 27 weeks and 2 days.  She has been followed due to maternal obesity with a BMI of 53.  She denies any problems since her last exam.  On today's exam, the overall EFW of 2 pounds 5 ounces measures at the 37th percentile for her gestational age.    There was normal amniotic fluid noted.  Due to maternal obesity, she should be screened for gestational diabetes at her next prenatal visit.    We will continue to follow her with growth ultrasounds throughout her pregnancy.    Weekly fetal testing will be started at 34 weeks.    She will return in 4 weeks for another growth scan.    The patient stated that all of her questions were answered today.  A total of 20 minutes was spent counseling and coordinating the care for this patient.  Greater than 50% of the time was spent in direct face-to-face contact.

## 2023-09-03 ENCOUNTER — Encounter (HOSPITAL_BASED_OUTPATIENT_CLINIC_OR_DEPARTMENT_OTHER): Admitting: Obstetrics and Gynecology

## 2023-09-04 ENCOUNTER — Encounter (HOSPITAL_BASED_OUTPATIENT_CLINIC_OR_DEPARTMENT_OTHER): Payer: Medicaid Other | Admitting: Certified Nurse Midwife

## 2023-09-04 ENCOUNTER — Encounter (HOSPITAL_BASED_OUTPATIENT_CLINIC_OR_DEPARTMENT_OTHER): Admitting: Obstetrics and Gynecology

## 2023-09-06 ENCOUNTER — Ambulatory Visit (HOSPITAL_BASED_OUTPATIENT_CLINIC_OR_DEPARTMENT_OTHER): Admitting: Obstetrics and Gynecology

## 2023-09-06 VITALS — BP 118/83 | HR 91 | Wt 350.2 lb

## 2023-09-06 DIAGNOSIS — O99343 Other mental disorders complicating pregnancy, third trimester: Secondary | ICD-10-CM

## 2023-09-06 DIAGNOSIS — Z6841 Body Mass Index (BMI) 40.0 and over, adult: Secondary | ICD-10-CM

## 2023-09-06 DIAGNOSIS — F32A Depression, unspecified: Secondary | ICD-10-CM

## 2023-09-06 DIAGNOSIS — O99113 Other diseases of the blood and blood-forming organs and certain disorders involving the immune mechanism complicating pregnancy, third trimester: Secondary | ICD-10-CM | POA: Diagnosis not present

## 2023-09-06 DIAGNOSIS — Z3A29 29 weeks gestation of pregnancy: Secondary | ICD-10-CM

## 2023-09-06 DIAGNOSIS — O99213 Obesity complicating pregnancy, third trimester: Secondary | ICD-10-CM

## 2023-09-06 DIAGNOSIS — D563 Thalassemia minor: Secondary | ICD-10-CM

## 2023-09-06 DIAGNOSIS — Z23 Encounter for immunization: Secondary | ICD-10-CM

## 2023-09-06 DIAGNOSIS — O0990 Supervision of high risk pregnancy, unspecified, unspecified trimester: Secondary | ICD-10-CM

## 2023-09-06 DIAGNOSIS — O0991 Supervision of high risk pregnancy, unspecified, first trimester: Secondary | ICD-10-CM

## 2023-09-06 DIAGNOSIS — E669 Obesity, unspecified: Secondary | ICD-10-CM

## 2023-09-06 NOTE — Progress Notes (Signed)
 PRENATAL VISIT NOTE  Subjective:  Wendy Harrington is a 23 y.o. G1P0 at [redacted]w[redacted]d being seen today for ongoing prenatal care.  She is currently monitored for the following issues for this high-risk pregnancy and has ADHD (attention deficit hyperactivity disorder); Supervision of high risk pregnancy, antepartum; BMI 50.0-59.9, adult (HCC); Depression; Maternal gonorrhea in first trimester; Elevated blood pressure reading; and Alpha thalassemia silent carrier on their problem list.  Patient reports no complaints.  Contractions: Not present. Vag. Bleeding: None.  Movement: Present. Denies leaking of fluid.   The following portions of the patient's history were reviewed and updated as appropriate: allergies, current medications, past family history, past medical history, past social history, past surgical history and problem list.   Objective:    Vitals:   09/06/23 1014  BP: 118/83  Pulse: 91  Weight: (!) 350 lb 3.2 oz (158.8 kg)    Fetal Status:  Fetal Heart Rate (bpm): 148   Movement: Present    General: Alert, oriented and cooperative. Patient is in no acute distress.  Skin: Skin is warm and dry. No rash noted.   Cardiovascular: Normal heart rate noted  Respiratory: Normal respiratory effort, no problems with respiration noted  Abdomen: Soft, gravid, appropriate for gestational age.  Pain/Pressure: Absent     Pelvic: Cervical exam deferred        Extremities: Normal range of motion.  Edema: None  Mental Status: Normal mood and affect. Normal behavior. Normal judgment and thought content.   Assessment and Plan:  Pregnancy: G1P0 at [redacted]w[redacted]d 1. Supervision of high risk pregnancy in first trimester (Primary) BP and FHR normal Doing well, feeling regular movement    2. BMI 50.0-59.9, adult (HCC) Met with dietician, continue measures  Continue ASA 6/6 u/s EFW 37%, AFI normal Continue growth u/s and weekly testing starting at 34 weeks Has not had GTT yet, fasting today, will complete  GTT and labs today   3. Hx Depression/Anxiety (pt quit Fluoxetine  few months prior pregnancy) Reports she is doing very well   4. Alpha thalassemia silent carrier  5. [redacted] weeks gestation of pregnancy Tdap discussed and given today  Discussed options for birth control today to include Pills, implant, IUD, will think about her options  Preterm labor symptoms and general obstetric precautions including but not limited to vaginal bleeding, contractions, leaking of fluid and fetal movement were reviewed in detail with the patient. Please refer to After Visit Summary for other counseling recommendations.   Return in two weeks for routine prenatal  Future Appointments  Date Time Provider Department Center  09/18/2023 10:35 AM Lo, Juvenal Opoka, CNM [facility]  09/24/2023  9:00 AM [facility] PROVIDER 1 [facility]  09/24/2023  9:30 AM [facility] US1 [facility]  10/02/2023 10:15 AM Lillian Rein, MD [facility]  10/02/2023  2:00 PM [facility] PROVIDER 1 [facility]  10/02/2023  2:15 PM [facility] US6 [facility]  10/08/2023 11:00 AM [facility] PROVIDER 1 [facility]  10/08/2023 11:30 AM [facility] US1 [facility]  10/15/2023 10:00 AM [facility] PROVIDER 1 [facility]  10/15/2023 10:30 AM [facility] US2 [facility]  10/16/2023 10:15 AM Lo, Juvenal Opoka, CNM [facility]  10/22/2023 10:00 AM [facility] PROVIDER 1 [facility]  10/22/2023 10:30 AM [facility] US1 [facility]  10/23/2023 10:15 AM Lo, Juvenal Opoka, CNM [facility]  10/30/2023 10:15 AM Lo, Juvenal Opoka, CNM [facility]  11/06/2023 10:15 AM Lillian Rein, MD [facility]  11/13/2023 10:15 AM Lo, Juvenal Opoka, CNM [facility]  Susi Eric, FNP

## 2023-09-07 LAB — RPR: RPR Ser Ql: NONREACTIVE

## 2023-09-07 LAB — CBC
Hematocrit: 36 % (ref 34.0–46.6)
Hemoglobin: 11.8 g/dL (ref 11.1–15.9)
MCH: 29.2 pg (ref 26.6–33.0)
MCHC: 32.8 g/dL (ref 31.5–35.7)
MCV: 89 fL (ref 79–97)
Platelets: 363 10*3/uL (ref 150–450)
RBC: 4.04 x10E6/uL (ref 3.77–5.28)
RDW: 13.9 % (ref 11.7–15.4)
WBC: 12.9 10*3/uL — ABNORMAL HIGH (ref 3.4–10.8)

## 2023-09-07 LAB — GLUCOSE TOLERANCE, 2 HOURS W/ 1HR
Glucose, 1 hour: 80 mg/dL (ref 70–179)
Glucose, 2 hour: 81 mg/dL (ref 70–152)
Glucose, Fasting: 69 mg/dL — ABNORMAL LOW (ref 70–91)

## 2023-09-07 LAB — HIV ANTIBODY (ROUTINE TESTING W REFLEX): HIV Screen 4th Generation wRfx: NONREACTIVE

## 2023-09-10 ENCOUNTER — Ambulatory Visit: Payer: Self-pay | Admitting: Obstetrics and Gynecology

## 2023-09-10 DIAGNOSIS — O0990 Supervision of high risk pregnancy, unspecified, unspecified trimester: Secondary | ICD-10-CM

## 2023-09-18 ENCOUNTER — Ambulatory Visit (HOSPITAL_BASED_OUTPATIENT_CLINIC_OR_DEPARTMENT_OTHER): Payer: Medicaid Other | Admitting: Certified Nurse Midwife

## 2023-09-18 VITALS — BP 129/82 | HR 83 | Wt 352.8 lb

## 2023-09-18 DIAGNOSIS — Z3A30 30 weeks gestation of pregnancy: Secondary | ICD-10-CM

## 2023-09-18 DIAGNOSIS — O0993 Supervision of high risk pregnancy, unspecified, third trimester: Secondary | ICD-10-CM

## 2023-09-18 DIAGNOSIS — O0991 Supervision of high risk pregnancy, unspecified, first trimester: Secondary | ICD-10-CM

## 2023-09-18 NOTE — Addendum Note (Signed)
 Addended byBETHA TAD RACE on: 09/18/2023 02:59 PM   Modules accepted: Level of Service
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
## 2023-09-24 ENCOUNTER — Ambulatory Visit: Attending: Obstetrics and Gynecology | Admitting: Obstetrics and Gynecology

## 2023-09-24 ENCOUNTER — Ambulatory Visit (HOSPITAL_BASED_OUTPATIENT_CLINIC_OR_DEPARTMENT_OTHER)

## 2023-09-24 VITALS — BP 129/90 | HR 102

## 2023-09-24 VITALS — BP 144/97

## 2023-09-24 DIAGNOSIS — Z148 Genetic carrier of other disease: Secondary | ICD-10-CM | POA: Diagnosis not present

## 2023-09-24 DIAGNOSIS — D563 Thalassemia minor: Secondary | ICD-10-CM

## 2023-09-24 DIAGNOSIS — Z6841 Body Mass Index (BMI) 40.0 and over, adult: Secondary | ICD-10-CM

## 2023-09-24 DIAGNOSIS — O0990 Supervision of high risk pregnancy, unspecified, unspecified trimester: Secondary | ICD-10-CM

## 2023-09-24 DIAGNOSIS — Z362 Encounter for other antenatal screening follow-up: Secondary | ICD-10-CM | POA: Insufficient documentation

## 2023-09-24 DIAGNOSIS — O99213 Obesity complicating pregnancy, third trimester: Secondary | ICD-10-CM

## 2023-09-24 DIAGNOSIS — O99013 Anemia complicating pregnancy, third trimester: Secondary | ICD-10-CM

## 2023-09-24 DIAGNOSIS — Z3A31 31 weeks gestation of pregnancy: Secondary | ICD-10-CM | POA: Insufficient documentation

## 2023-09-24 DIAGNOSIS — E669 Obesity, unspecified: Secondary | ICD-10-CM

## 2023-09-24 DIAGNOSIS — O99212 Obesity complicating pregnancy, second trimester: Secondary | ICD-10-CM

## 2023-09-24 NOTE — Progress Notes (Signed)
 Maternal-Fetal Medicine Consultation Name: Wendy Harrington MRN: [id number]  G1 P0 at 31w 5d gestation. Pregravid BMI 53. Patient does not have gestational diabetes.  Blood pressures today at our office were 129/90 and 144/97 mmHg.  Patient was having intermittent headache that has resolved now. No history of visual disturbances or right upper quadrant pain or vaginal bleeding.  Ultrasound Fetal growth is appropriate for gestational age.  Amniotic fluid normal good fetal activity seen.  I reassured the patient of the findings.  I discussed gestational hypertension/preeclampsia that is more common in first pregnancies and in women with grade 3 obesity.  Patient has blood pressure cuff at home, and I encouraged her to bring her blood pressure cuff to our office or your office to check for accuracy. I advised the patient to go to the maternity admissions unit if she has persistent headache. I educated her on the signs and symptoms of severe features of preeclampsia.  Recommendations - Continue weekly antenatal testing till delivery.   Consultation including face-to-face (more than 50%) counseling 10 minutes.

## 2023-10-01 ENCOUNTER — Other Ambulatory Visit

## 2023-10-01 ENCOUNTER — Ambulatory Visit

## 2023-10-02 ENCOUNTER — Ambulatory Visit (INDEPENDENT_AMBULATORY_CARE_PROVIDER_SITE_OTHER): Payer: Medicaid Other | Admitting: Obstetrics & Gynecology

## 2023-10-02 ENCOUNTER — Ambulatory Visit: Admitting: *Deleted

## 2023-10-02 ENCOUNTER — Ambulatory Visit

## 2023-10-02 ENCOUNTER — Ambulatory Visit: Attending: Obstetrics and Gynecology | Admitting: Obstetrics

## 2023-10-02 ENCOUNTER — Other Ambulatory Visit (HOSPITAL_BASED_OUTPATIENT_CLINIC_OR_DEPARTMENT_OTHER): Payer: Self-pay | Admitting: Certified Nurse Midwife

## 2023-10-02 VITALS — BP 145/77 | HR 104

## 2023-10-02 VITALS — BP 135/86 | HR 79 | Wt 354.0 lb

## 2023-10-02 DIAGNOSIS — E66813 Obesity, class 3: Secondary | ICD-10-CM

## 2023-10-02 DIAGNOSIS — Z148 Genetic carrier of other disease: Secondary | ICD-10-CM | POA: Insufficient documentation

## 2023-10-02 DIAGNOSIS — Z3A32 32 weeks gestation of pregnancy: Secondary | ICD-10-CM | POA: Diagnosis not present

## 2023-10-02 DIAGNOSIS — O0993 Supervision of high risk pregnancy, unspecified, third trimester: Secondary | ICD-10-CM

## 2023-10-02 DIAGNOSIS — O99013 Anemia complicating pregnancy, third trimester: Secondary | ICD-10-CM | POA: Diagnosis not present

## 2023-10-02 DIAGNOSIS — O0990 Supervision of high risk pregnancy, unspecified, unspecified trimester: Secondary | ICD-10-CM

## 2023-10-02 DIAGNOSIS — D563 Thalassemia minor: Secondary | ICD-10-CM | POA: Diagnosis not present

## 2023-10-02 DIAGNOSIS — Z3A34 34 weeks gestation of pregnancy: Secondary | ICD-10-CM

## 2023-10-02 DIAGNOSIS — O09893 Supervision of other high risk pregnancies, third trimester: Secondary | ICD-10-CM

## 2023-10-02 DIAGNOSIS — O99213 Obesity complicating pregnancy, third trimester: Secondary | ICD-10-CM

## 2023-10-02 DIAGNOSIS — R21 Rash and other nonspecific skin eruption: Secondary | ICD-10-CM

## 2023-10-02 DIAGNOSIS — O09899 Supervision of other high risk pregnancies, unspecified trimester: Secondary | ICD-10-CM

## 2023-10-02 DIAGNOSIS — E669 Obesity, unspecified: Secondary | ICD-10-CM

## 2023-10-02 DIAGNOSIS — Z6841 Body Mass Index (BMI) 40.0 and over, adult: Secondary | ICD-10-CM

## 2023-10-02 MED ORDER — TRIAMCINOLONE ACETONIDE 0.5 % EX CREA: 1.0000 | TOPICAL_CREAM | Freq: Three times a day (TID) | CUTANEOUS | 0 refills | Status: DC

## 2023-10-02 NOTE — Patient Instructions (Signed)

## 2023-10-02 NOTE — Progress Notes (Signed)
 MFM Consult Note  Wendy Harrington is currently at 32 weeks and 6 days.  She has been followed due to maternal obesity with a BMI of 53.    She denies any problems since her last exam and reports feeling fetal movements throughout the day.  A biophysical profile performed today was 8/10 with a reactive NST.  She received a -2 for fetal breathing movements that did not meet criteria.  There was normal amniotic fluid noted with a total AFI of 13.5 cm.  Due to maternal obesity, we will continue to follow her with weekly fetal testing until delivery.    She will return in 1 week for a BPP.  The patient stated that all of her questions were answered today.  A total of 10 minutes was spent counseling and coordinating the care for this patient.  Greater than 50% of the time was spent in direct face-to-face contact.

## 2023-10-02 NOTE — Procedures (Signed)
 Wendy Harrington Jan 13, 2001 [redacted]w[redacted]d  Fetus A Non-Stress Test Interpretation for 10/02/23- NST with BPP  Indication: Unsatisfactory BPP and morbid obesity  Fetal Heart Rate A Mode: External Baseline Rate (A): 140 bpm Variability: Moderate Accelerations: 15 x 15 Decelerations: None Multiple birth?: No  Uterine Activity Mode: Toco Contraction Frequency (min): none Resting Tone Palpated: Relaxed  Interpretation (Fetal Testing) Nonstress Test Interpretation: Reactive Comments: Tracing reviewed byDr. Ileana

## 2023-10-03 LAB — BILE ACIDS, TOTAL: Bile Acids Total: 2.8 umol/L (ref 0.0–10.0)

## 2023-10-05 ENCOUNTER — Ambulatory Visit (HOSPITAL_BASED_OUTPATIENT_CLINIC_OR_DEPARTMENT_OTHER): Payer: Self-pay | Admitting: Obstetrics & Gynecology

## 2023-10-05 ENCOUNTER — Encounter (HOSPITAL_BASED_OUTPATIENT_CLINIC_OR_DEPARTMENT_OTHER): Payer: Self-pay | Admitting: Obstetrics & Gynecology

## 2023-10-05 NOTE — Progress Notes (Signed)
   PRENATAL VISIT NOTE  Subjective:  Wendy Harrington is a 23 y.o. G1P0 at [redacted]w[redacted]d being seen today for ongoing prenatal care.  She is currently monitored for the following issues for this high-risk pregnancy and has ADHD (attention deficit hyperactivity disorder); Supervision of high risk pregnancy, antepartum; BMI 50.0-59.9, adult (HCC); Depression; Maternal gonorrhea in first trimester; and Alpha thalassemia silent carrier on their problem list.  Patient reports itchy pigmented rash on chest.  Will check bile acids.  Reasoning for testing discussed but feel topical steroid will likely help.  Contractions: Not present.  .  Movement: Present. Denies leaking of fluid.   The following portions of the patient's history were reviewed and updated as appropriate: allergies, current medications, past family history, past medical history, past social history, past surgical history and problem list.   Objective:   Vitals:   10/02/23 1031  BP: 135/86  Pulse: 79  Weight: (!) 354 lb (160.6 kg)    Fetal Status: Fetal Heart Rate (bpm): 141 Fundal Height: 34 cm  Movement: Present    General:  Alert, oriented and cooperative. Patient is in no acute distress.  Skin: Skin is warm and dry. No rash noted.   Cardiovascular: Normal heart rate noted  Respiratory: Normal respiratory effort, no problems with respiration noted  Abdomen: Soft, gravid, appropriate for gestational age.  Pain/Pressure: Present     Pelvic: Cervical exam deferred        Extremities: Normal range of motion.  Edema: Mild pitting, slight indentation  Mental Status: Normal mood and affect. Normal behavior. Normal judgment and thought content.   Assessment and Plan:  Pregnancy: G1P0 at [redacted]w[redacted]d 1. Supervision of high risk pregnancy, antepartum (Primary) - on baby ASA and PNV  2. [redacted] weeks gestation of pregnancy - recheck 2 weeks  3. BMI 50.0-59.9, adult (HCC) - has growth scan scheduled this afternoon - she is going to take her  blood pressure cuff with her to make sure readings are similar to with manual cuff - recommended taking 1-2 times weekly or with any symptoms concerning for pre-eclampsia  4. Alpha thalassemia silent carrier  5. Rash - Bile acids, total - if negative, will send in rx for topical steroid ointment  6. History of maternal gonorrhea, currently pregnant - testing was positive x 2 and she was treated x 2.  TOC negative 5/27.  Will around 36 weeks.    Preterm labor symptoms and general obstetric precautions including but not limited to vaginal bleeding, contractions, leaking of fluid and fetal movement were reviewed in detail with the patient. Please refer to After Visit Summary for other counseling recommendations.   Return in about 2 weeks (around 10/16/2023).  Future Appointments  Date Time Provider Department Center  10/08/2023 11:00 AM [facility] PROVIDER 1 [facility]  10/08/2023 11:30 AM [facility] US1 [facility]  10/15/2023 10:00 AM [facility] PROVIDER 1 [facility]  10/15/2023 10:30 AM [facility] US2 [facility]  10/16/2023  9:55 AM Lo, Arland POUR, CNM [facility]  10/22/2023 10:00 AM [facility] PROVIDER 1 [facility]  10/22/2023 10:30 AM [facility] US1 [facility]  10/23/2023 10:15 AM Lo, Arland POUR, CNM [facility]  10/30/2023 10:15 AM Lo, Arland POUR, CNM [facility]  11/06/2023 10:15 AM Cleotilde Ronal RAMAN, MD [facility]  11/13/2023 10:15 AM Lo, Arland POUR, CNM [facility]    Ronal RAMAN Cleotilde, MD

## 2023-10-08 ENCOUNTER — Ambulatory Visit

## 2023-10-08 ENCOUNTER — Ambulatory Visit: Attending: Obstetrics and Gynecology | Admitting: Maternal & Fetal Medicine

## 2023-10-08 ENCOUNTER — Other Ambulatory Visit: Payer: Self-pay | Admitting: *Deleted

## 2023-10-08 VITALS — BP 122/84

## 2023-10-08 VITALS — BP 143/88 | HR 88

## 2023-10-08 DIAGNOSIS — O99210 Obesity complicating pregnancy, unspecified trimester: Secondary | ICD-10-CM

## 2023-10-08 DIAGNOSIS — D563 Thalassemia minor: Secondary | ICD-10-CM

## 2023-10-08 DIAGNOSIS — Z3A33 33 weeks gestation of pregnancy: Secondary | ICD-10-CM

## 2023-10-08 DIAGNOSIS — O0993 Supervision of high risk pregnancy, unspecified, third trimester: Secondary | ICD-10-CM | POA: Diagnosis not present

## 2023-10-08 DIAGNOSIS — O0990 Supervision of high risk pregnancy, unspecified, unspecified trimester: Secondary | ICD-10-CM

## 2023-10-08 DIAGNOSIS — Z148 Genetic carrier of other disease: Secondary | ICD-10-CM | POA: Diagnosis not present

## 2023-10-08 DIAGNOSIS — O99213 Obesity complicating pregnancy, third trimester: Secondary | ICD-10-CM | POA: Insufficient documentation

## 2023-10-08 DIAGNOSIS — Z6841 Body Mass Index (BMI) 40.0 and over, adult: Secondary | ICD-10-CM | POA: Diagnosis not present

## 2023-10-08 DIAGNOSIS — O99013 Anemia complicating pregnancy, third trimester: Secondary | ICD-10-CM | POA: Diagnosis not present

## 2023-10-08 NOTE — Progress Notes (Signed)
   Patient information  Patient Name: Wendy Harrington  Patient MRN:   [id number]  Referring practice: MFM Referring Provider: [facility]  Problem List   Patient Active Problem List   Diagnosis Date Noted   Alpha thalassemia silent carrier 05/21/2023   Maternal gonorrhea in first trimester 05/09/2023   Supervision of high risk pregnancy, antepartum 05/08/2023   BMI 50.0-59.9, adult (HCC) 05/08/2023   Depression 05/08/2023   ADHD (attention deficit hyperactivity disorder) 07/09/2012    Maternal Fetal medicine Consult  Wendy Harrington is a 23 y.o. G1P0 at [redacted]w[redacted]d here for ultrasound and consultation. Wendy Harrington is doing well today with no acute concerns. Today we focused on the following:   The patient is doing well today.  Initially her blood pressure was slightly elevated at 143/88.  However the repeat blood pressure was 122/84 and this should be considered  her blood pressure for today.  I discussed the implications of elevated blood pressure during pregnancy in the event her blood pressure were to remain elevated in subsequent visits.  Due to elevated BMI we will continue to do weekly antenatal testing which was reassuring today.  The patient had time to ask questions that were answered to her satisfaction.  She verbalized understanding and agrees to proceed with the plan below.  Sonographic findings Single intrauterine pregnancy. Fetal cardiac activity: Observed. Presentation: Cephalic. Interval fetal anatomy appears normal. Amniotic fluid: Within normal limits.  MVP: 5.17 cm. Placenta: Anterior. BPP: 8/8.   There are limitations of prenatal ultrasound such as the inability to detect certain abnormalities due to poor visualization. Various factors such as fetal position, gestational age and maternal body habitus may increase the difficulty in visualizing the fetal anatomy.    Recommendations - Continue weekly antenatal testing and serial growth ultrasounds  as scheduled  Review of Systems: A review of systems was performed and was negative except per HPI   Vitals and Physical Exam    10/08/2023   11:42 AM 10/08/2023   11:02 AM 10/02/2023    2:02 PM  Vitals with BMI  Systolic 122 143 854  Diastolic 84 88 77  Pulse  88 104    Sitting comfortably on the sonogram table Nonlabored breathing Normal rate and rhythm Abdomen is nontender  Past pregnancies OB History  Gravida Para Term Preterm AB Living  1       SAB IAB Ectopic Multiple Live Births          # Outcome Date GA Lbr Len/2nd Weight Sex Type Anes PTL Lv  1 Current              I spent 20 minutes reviewing the patients chart, including labs and images as well as counseling the patient about her medical conditions. Greater than 50% of the time was spent in direct face-to-face patient counseling.  [name]  MFM, [facility]   10/08/2023  11:52 AM

## 2023-10-15 ENCOUNTER — Ambulatory Visit: Attending: Obstetrics and Gynecology | Admitting: Maternal & Fetal Medicine

## 2023-10-15 ENCOUNTER — Ambulatory Visit

## 2023-10-15 VITALS — BP 130/87

## 2023-10-15 VITALS — BP 139/93 | HR 87

## 2023-10-15 DIAGNOSIS — O0990 Supervision of high risk pregnancy, unspecified, unspecified trimester: Secondary | ICD-10-CM

## 2023-10-15 DIAGNOSIS — E669 Obesity, unspecified: Secondary | ICD-10-CM | POA: Diagnosis not present

## 2023-10-15 DIAGNOSIS — D563 Thalassemia minor: Secondary | ICD-10-CM

## 2023-10-15 DIAGNOSIS — O99210 Obesity complicating pregnancy, unspecified trimester: Secondary | ICD-10-CM

## 2023-10-15 DIAGNOSIS — Z3A34 34 weeks gestation of pregnancy: Secondary | ICD-10-CM | POA: Diagnosis not present

## 2023-10-15 DIAGNOSIS — O99213 Obesity complicating pregnancy, third trimester: Secondary | ICD-10-CM | POA: Insufficient documentation

## 2023-10-15 DIAGNOSIS — Z148 Genetic carrier of other disease: Secondary | ICD-10-CM | POA: Insufficient documentation

## 2023-10-15 DIAGNOSIS — O99013 Anemia complicating pregnancy, third trimester: Secondary | ICD-10-CM

## 2023-10-15 DIAGNOSIS — Z6841 Body Mass Index (BMI) 40.0 and over, adult: Secondary | ICD-10-CM

## 2023-10-15 DIAGNOSIS — O0993 Supervision of high risk pregnancy, unspecified, third trimester: Secondary | ICD-10-CM | POA: Diagnosis not present

## 2023-10-15 NOTE — Progress Notes (Signed)
   Patient information  Patient Name: Wendy Harrington  Patient MRN:   [id number]  Referring practice: MFM Referring Provider: [facility]  Problem List   Patient Active Problem List   Diagnosis Date Noted   Alpha thalassemia silent carrier 05/21/2023   Maternal gonorrhea in first trimester 05/09/2023   Supervision of high risk pregnancy, antepartum 05/08/2023   BMI 50.0-59.9, adult (HCC) 05/08/2023   Depression 05/08/2023   ADHD (attention deficit hyperactivity disorder) 07/09/2012    Maternal Fetal medicine Consult  Wendy Harrington is a 23 y.o. G1P0 at [redacted]w[redacted]d here for ultrasound and consultation. Wendy Harrington is doing well today with no acute concerns. Today we focused on the following:   BPP 8/8. Blood pressure was slightly elevated today initially at 139/93.  Repeat was 130/87.  She otherwise feels well.  She will continue to follow-up with weekly antenatal testing and growth ultrasounds every 4 weeks.  Delivery will be around her EDD or sooner if indicated.  The patient had time to ask questions that were answered to her satisfaction.  She verbalized understanding and agrees to proceed with the plan below.  Sonographic findings Single intrauterine pregnancy. Fetal cardiac activity: Observed. Presentation: Cephalic. Interval fetal anatomy appears normal exept for. Placenta: Anterior. BPP: 8/8.  There are limitations of prenatal ultrasound such as the inability to detect certain abnormalities due to poor visualization. Various factors such as fetal position, gestational age and maternal body habitus may increase the difficulty in visualizing the fetal anatomy.    Recommendations -Continue weekly testing and growth ultrasounds are scheduled -Delivery around EDD or sooner if indicated  Review of Systems: A review of systems was performed and was negative except per HPI   Vitals and Physical Exam    10/15/2023   10:29 AM 10/15/2023    9:57 AM 10/08/2023    11:42 AM  Vitals with BMI  Systolic 130 139 877  Diastolic 87 93 84  Pulse  87     Sitting comfortably on the sonogram table Nonlabored breathing Normal rate and rhythm Abdomen is nontender  Past pregnancies OB History  Gravida Para Term Preterm AB Living  1       SAB IAB Ectopic Multiple Live Births          # Outcome Date GA Lbr Len/2nd Weight Sex Type Anes PTL Lv  1 Current              I spent 10 minutes reviewing the patients chart, including labs and images as well as counseling the patient about her medical conditions. Greater than 50% of the time was spent in direct face-to-face patient counseling.  Wendy Harrington  MFM, [facility]   10/15/2023  10:29 AM

## 2023-10-16 ENCOUNTER — Ambulatory Visit (INDEPENDENT_AMBULATORY_CARE_PROVIDER_SITE_OTHER): Payer: Medicaid Other | Admitting: Certified Nurse Midwife

## 2023-10-16 ENCOUNTER — Other Ambulatory Visit (HOSPITAL_COMMUNITY)
Admission: RE | Admit: 2023-10-16 | Discharge: 2023-10-16 | Disposition: A | Discharge status: Home / Self Care | Source: Ambulatory Visit | Attending: Certified Nurse Midwife | Admitting: Certified Nurse Midwife

## 2023-10-16 VITALS — BP 128/81 | HR 88 | Wt 367.0 lb

## 2023-10-16 DIAGNOSIS — O99213 Obesity complicating pregnancy, third trimester: Secondary | ICD-10-CM

## 2023-10-16 DIAGNOSIS — O0990 Supervision of high risk pregnancy, unspecified, unspecified trimester: Secondary | ICD-10-CM | POA: Diagnosis present

## 2023-10-16 DIAGNOSIS — Z3A34 34 weeks gestation of pregnancy: Secondary | ICD-10-CM

## 2023-10-16 DIAGNOSIS — D563 Thalassemia minor: Secondary | ICD-10-CM

## 2023-10-16 DIAGNOSIS — O0993 Supervision of high risk pregnancy, unspecified, third trimester: Secondary | ICD-10-CM | POA: Diagnosis not present

## 2023-10-16 NOTE — Progress Notes (Signed)
    PRENATAL VISIT NOTE  Subjective:  Wendy Harrington is a 23 y.o. G1P0 at [redacted]w[redacted]d being seen today for ongoing prenatal care.  She is currently monitored for the following issues for this  pregnancy and has ADHD (attention deficit hyperactivity disorder); Supervision of high risk pregnancy, antepartum; BMI 50.0-59.9, adult (HCC); Depression; Maternal gonorrhea in first trimester; and Alpha thalassemia silent carrier on their problem list.  Patient reports no bleeding, no contractions, no cramping, and no leaking.  Contractions: Not present. Vag. Bleeding: None.  Movement: Present. Denies leaking of fluid.   The following portions of the patient's history were reviewed and updated as appropriate: allergies, current medications, past family history, past medical history, past social history, past surgical history and problem list.   Objective:    Vitals:   10/16/23 1015  BP: 128/81  Pulse: 88  Weight: (!) 367 lb (166.5 kg)    Fetal Status:      Movement: Present    General: Alert, oriented and cooperative. Patient is in no acute distress.  Skin: Skin is warm and dry. No rash noted.   Cardiovascular: Normal heart rate noted  Respiratory: Normal respiratory effort, no problems with respiration noted  Abdomen: Soft, gravid, appropriate for gestational age.  Pain/Pressure: Absent     Pelvic: Cervical exam deferred        Extremities: Normal range of motion.  Edema: None  Mental Status: Normal mood and affect. Normal behavior. Normal judgment and thought content.   Assessment and Plan:  Pregnancy: G1P0 at [redacted]w[redacted]d  1. Supervision of high risk pregnancy, antepartum (Primary) - on baby ASA and PNV - US  (09/24/23): Vtx, anterior placenta, AFI wnl, EFW 3lb 10oz., 1633gm, (14%), HC 3.6%, AC 12%, FL 32%. BP elevated 129/90 and 144/97.  - US  (10/16/23): Vtx, anterior placenta, AFV wnl, BPP 8/8. BP 139/93. Repeat 130/87. Continue weekly antenatal testing and growth US  q4wks. Plan delivery around EDD  or sooner if indicated.  - Baseline PreE labs collected today with Urine Prot/Creat Ratio.   2. [redacted] weeks gestation of pregnancy - recheck 2 weeks   3. BMI 50.0-59.9, adult (HCC)   4. Alpha thalassemia silent carrier   5. History of maternal gonorrhea, currently pregnant - testing was positive x 2 and she was treated x 2.  TOC negative 5/27.  Repeat today and in 2 weeks.    Preterm labor symptoms and general obstetric precautions including but not limited to vaginal bleeding, contractions, leaking of fluid and fetal movement were reviewed in detail with the patient. Please refer to After Visit Summary for other counseling recommendations.    Future Appointments  Date Time Provider Department Center  10/22/2023 10:00 AM [facility] PROVIDER 1 [facility]  10/22/2023 10:30 AM [facility] US1 [facility]  10/23/2023  9:55 AM Jenesis Martin, Arland POUR, CNM [facility]  10/29/2023  3:15 PM [facility] NST [facility]  10/30/2023 10:15 AM Yasmeen Manka, Arland POUR, CNM [facility]  11/05/2023  3:15 PM [facility] NST [facility]  11/06/2023 10:15 AM Cleotilde Ronal RAMAN, MD [facility]  11/13/2023 10:15 AM Denora Wysocki, Arland POUR, CNM [facility]    Arland POUR Roller, CNM

## 2023-10-17 ENCOUNTER — Ambulatory Visit (HOSPITAL_BASED_OUTPATIENT_CLINIC_OR_DEPARTMENT_OTHER): Payer: Self-pay | Admitting: Certified Nurse Midwife

## 2023-10-17 LAB — CERVICOVAGINAL ANCILLARY ONLY
Chlamydia: NEGATIVE
Comment: NEGATIVE
Comment: NORMAL
Neisseria Gonorrhea: NEGATIVE

## 2023-10-17 LAB — CBC
Hematocrit: 35.9 % (ref 34.0–46.6)
Hemoglobin: 11.7 g/dL (ref 11.1–15.9)
MCH: 28.5 pg (ref 26.6–33.0)
MCHC: 32.6 g/dL (ref 31.5–35.7)
MCV: 87 fL (ref 79–97)
Platelets: 359 x10E3/uL (ref 150–450)
RBC: 4.11 x10E6/uL (ref 3.77–5.28)
RDW: 14.9 % (ref 11.7–15.4)
WBC: 10.9 x10E3/uL — ABNORMAL HIGH (ref 3.4–10.8)

## 2023-10-17 LAB — COMPREHENSIVE METABOLIC PANEL WITH GFR
ALT: 10 IU/L (ref 0–32)
AST: 13 IU/L (ref 0–40)
Albumin: 3.3 g/dL — ABNORMAL LOW (ref 4.0–5.0)
Alkaline Phosphatase: 104 IU/L (ref 44–121)
BUN/Creatinine Ratio: 14 (ref 9–23)
BUN: 6 mg/dL (ref 6–20)
Bilirubin Total: 0.2 mg/dL (ref 0.0–1.2)
CO2: 18 mmol/L — ABNORMAL LOW (ref 20–29)
Calcium: 9 mg/dL (ref 8.7–10.2)
Chloride: 102 mmol/L (ref 96–106)
Creatinine, Ser: 0.42 mg/dL — ABNORMAL LOW (ref 0.57–1.00)
Globulin, Total: 3.1 g/dL (ref 1.5–4.5)
Glucose: 73 mg/dL (ref 70–99)
Potassium: 4 mmol/L (ref 3.5–5.2)
Sodium: 131 mmol/L — ABNORMAL LOW (ref 134–144)
Total Protein: 6.4 g/dL (ref 6.0–8.5)
eGFR: 143 mL/min/1.73 (ref >59)

## 2023-10-17 LAB — PROTEIN / CREATININE RATIO, URINE
Creatinine, Urine: 183.6 mg/dL
Protein, Ur: 33.6 mg/dL
Protein/Creat Ratio: 183 mg/g{creat} (ref 0–200)

## 2023-10-22 ENCOUNTER — Ambulatory Visit (HOSPITAL_BASED_OUTPATIENT_CLINIC_OR_DEPARTMENT_OTHER): Attending: Obstetrics and Gynecology | Admitting: Maternal & Fetal Medicine

## 2023-10-22 ENCOUNTER — Other Ambulatory Visit: Payer: Self-pay | Admitting: *Deleted

## 2023-10-22 ENCOUNTER — Other Ambulatory Visit (HOSPITAL_BASED_OUTPATIENT_CLINIC_OR_DEPARTMENT_OTHER): Payer: Self-pay | Admitting: Certified Nurse Midwife

## 2023-10-22 ENCOUNTER — Ambulatory Visit (HOSPITAL_BASED_OUTPATIENT_CLINIC_OR_DEPARTMENT_OTHER)

## 2023-10-22 VITALS — BP 131/86 | HR 86

## 2023-10-22 DIAGNOSIS — Z3A35 35 weeks gestation of pregnancy: Secondary | ICD-10-CM | POA: Insufficient documentation

## 2023-10-22 DIAGNOSIS — D563 Thalassemia minor: Secondary | ICD-10-CM

## 2023-10-22 DIAGNOSIS — O99213 Obesity complicating pregnancy, third trimester: Secondary | ICD-10-CM | POA: Insufficient documentation

## 2023-10-22 DIAGNOSIS — O0990 Supervision of high risk pregnancy, unspecified, unspecified trimester: Secondary | ICD-10-CM

## 2023-10-22 DIAGNOSIS — Z148 Genetic carrier of other disease: Secondary | ICD-10-CM | POA: Insufficient documentation

## 2023-10-22 DIAGNOSIS — O99013 Anemia complicating pregnancy, third trimester: Secondary | ICD-10-CM | POA: Diagnosis not present

## 2023-10-22 DIAGNOSIS — Z3689 Encounter for other specified antenatal screening: Secondary | ICD-10-CM | POA: Diagnosis not present

## 2023-10-22 DIAGNOSIS — Z6841 Body Mass Index (BMI) 40.0 and over, adult: Secondary | ICD-10-CM

## 2023-10-22 DIAGNOSIS — O365990 Maternal care for other known or suspected poor fetal growth, unspecified trimester, not applicable or unspecified: Secondary | ICD-10-CM

## 2023-10-22 NOTE — Progress Notes (Signed)
   Patient information  Patient Name: Wendy Harrington  Patient MRN:   [id number]  Referring practice: MFM Referring Provider: [facility]  Problem List   Patient Active Problem List   Diagnosis Date Noted   Maternal morbid obesity, antepartum (HCC) 10/16/2023   Alpha thalassemia silent carrier 05/21/2023   Maternal gonorrhea in first trimester 05/09/2023   Supervision of high risk pregnancy, antepartum 05/08/2023   BMI 50.0-59.9, adult (HCC) 05/08/2023   Depression 05/08/2023   ADHD (attention deficit hyperactivity disorder) 07/09/2012   Maternal Fetal medicine Consult  Wendy Harrington is a 23 y.o. G1P0 at [redacted]w[redacted]d here for ultrasound and consultation. Lovinia COUGHLIN is doing well today with no acute concerns. Today we focused on the following:   Due to elevated BMI the patient is here for a BPP which is 8/8. She reports good fetal movement.  I discussed the importance of monitoring her fetal movement as well as following up for antenatal testing in the next week.  I discussed the fetal growth is stable at the lower end of normal.  Previously it was at the 14th percentile and is now at the 11th percentile.  The patient had time to ask questions that were answered to her satisfaction.  She verbalized understanding and agrees to proceed with the plan below.  Sonographic findings Single intrauterine pregnancy. Fetal cardiac activity: Observed. Presentation: Cephalic. Interval fetal anatomy appears normal. Fetal biometry shows the estimated fetal weight at the 11 percentile. Amniotic fluid: Within normal limits.  MVP: 6.58 cm. Placenta: Anterior. BPP: 8/8.   There are limitations of prenatal ultrasound such as the inability to detect certain abnormalities due to poor visualization. Various factors such as fetal position, gestational age and maternal body habitus may increase the difficulty in visualizing the fetal anatomy.    Recommendations -Weekly antenatal testing  until delivery -Delivery around 39 weeks  Review of Systems: A review of systems was performed and was negative except per HPI   Vitals and Physical Exam    10/22/2023   10:22 AM 10/16/2023   10:15 AM 10/15/2023   10:29 AM  Vitals with BMI  Weight  367 lbs   Systolic 131 128 869  Diastolic 86 81 87  Pulse 86 88     Sitting comfortably on the sonogram table Nonlabored breathing Normal rate and rhythm Abdomen is nontender  Past pregnancies OB History  Gravida Para Term Preterm AB Living  1       SAB IAB Ectopic Multiple Live Births          # Outcome Date GA Lbr Len/2nd Weight Sex Type Anes PTL Lv  1 Current              I spent 20 minutes reviewing the patients chart, including labs and images as well as counseling the patient about her medical conditions. Greater than 50% of the time was spent in direct face-to-face patient counseling.  [name]  MFM, [facility]   10/22/2023  11:25 AM

## 2023-10-23 ENCOUNTER — Ambulatory Visit (INDEPENDENT_AMBULATORY_CARE_PROVIDER_SITE_OTHER): Payer: Medicaid Other | Admitting: Certified Nurse Midwife

## 2023-10-23 VITALS — BP 125/86 | HR 86 | Wt 377.0 lb

## 2023-10-23 DIAGNOSIS — Z8619 Personal history of other infectious and parasitic diseases: Secondary | ICD-10-CM

## 2023-10-23 DIAGNOSIS — O0990 Supervision of high risk pregnancy, unspecified, unspecified trimester: Secondary | ICD-10-CM

## 2023-10-23 DIAGNOSIS — O0993 Supervision of high risk pregnancy, unspecified, third trimester: Secondary | ICD-10-CM | POA: Diagnosis not present

## 2023-10-23 DIAGNOSIS — Z3A35 35 weeks gestation of pregnancy: Secondary | ICD-10-CM | POA: Diagnosis not present

## 2023-10-24 NOTE — Progress Notes (Signed)
    PRENATAL VISIT NOTE  Subjective:  Wendy Harrington is a 23 y.o. G1P0 at [redacted]w[redacted]d being seen today for ongoing prenatal care.  She is currently monitored for the following issues for this  pregnancy and has ADHD (attention deficit hyperactivity disorder); Supervision of high risk pregnancy, antepartum; BMI 50.0-59.9, adult (HCC); Depression; Maternal gonorrhea in first trimester; Alpha thalassemia silent carrier; and Maternal morbid obesity, antepartum (HCC) on their problem list.  Patient reports no bleeding, no contractions, no cramping, and no leaking.   .  .   . Denies leaking of fluid.   The following portions of the patient's history were reviewed and updated as appropriate: allergies, current medications, past family history, past medical history, past social history, past surgical history and problem list.   Objective:    Vitals:   10/23/23 1001  BP: 125/86  Pulse: 86  Weight: (!) 376 lb 15.8 oz (171 kg)    Fetal Status:           General: Alert, oriented and cooperative. Patient is in no acute distress.  Skin: Skin is warm and dry. No rash noted.   Cardiovascular: Normal heart rate noted  Respiratory: Normal respiratory effort, no problems with respiration noted  Abdomen: Soft, gravid, appropriate for gestational age.        Pelvic: Cervical exam deferred        Extremities: Normal range of motion.  Edema: None  Mental Status: Normal mood and affect. Normal behavior. Normal judgment and thought content.   Assessment and Plan:  Pregnancy: G1P0 at 35w  1. Supervision of high risk pregnancy, antepartum (Primary) - on baby ASA and PNV - US  (09/24/23): Vtx, anterior placenta, AFI wnl, EFW 3lb 10oz., 1633gm, (14%), HC 3.6%, AC 12%, FL 32%. BP elevated 129/90 and 144/97.  - US  (10/16/23): Vtx, anterior placenta, AFV wnl, BPP 8/8. BP 139/93. Repeat 130/87. Continue weekly antenatal testing and growth US  q4wks. Plan delivery around EDD or sooner if indicated.  - Baseline PreE labs  within normal ranges (10/16/23)   2. [redacted] weeks gestation of pregnancy - recheck 1 week   3. BMI 50.0-59.9, adult (HCC)   4. Alpha thalassemia silent carrier   5. History of maternal gonorrhea, currently pregnant - testing was positive x 2 and she was treated x 2.  TOC negative 5/27.  Repeat in 1 week.  Preterm labor symptoms and general obstetric precautions including but not limited to vaginal bleeding, contractions, leaking of fluid and fetal movement were reviewed in detail with the patient. Please refer to After Visit Summary for other counseling recommendations.   No follow-ups on file.  Future Appointments  Date Time Provider Department Center  10/29/2023  3:15 PM [facility] NST [facility]  10/30/2023 10:15 AM Aidon Klemens, Arland POUR, CNM [facility]  11/05/2023  3:15 PM [facility] NST [facility]  11/06/2023 10:15 AM Cleotilde Ronal RAMAN, MD [facility]  11/13/2023 10:15 AM Tad, Arland POUR, CNM [facility]  11/13/2023  2:45 PM [facility] PROVIDER 1 [facility]  11/13/2023  3:00 PM [facility] US3 [facility]    Arland POUR Tad, CNM

## 2023-10-25 ENCOUNTER — Encounter (HOSPITAL_BASED_OUTPATIENT_CLINIC_OR_DEPARTMENT_OTHER): Payer: Self-pay | Admitting: Obstetrics & Gynecology

## 2023-10-25 ENCOUNTER — Encounter (HOSPITAL_BASED_OUTPATIENT_CLINIC_OR_DEPARTMENT_OTHER): Admitting: Obstetrics & Gynecology

## 2023-10-26 ENCOUNTER — Ambulatory Visit (HOSPITAL_BASED_OUTPATIENT_CLINIC_OR_DEPARTMENT_OTHER): Admitting: Certified Nurse Midwife

## 2023-10-26 VITALS — BP 124/81 | HR 70 | Wt 374.0 lb

## 2023-10-26 DIAGNOSIS — O0993 Supervision of high risk pregnancy, unspecified, third trimester: Secondary | ICD-10-CM | POA: Diagnosis not present

## 2023-10-26 DIAGNOSIS — Z3A36 36 weeks gestation of pregnancy: Secondary | ICD-10-CM

## 2023-10-26 DIAGNOSIS — O0990 Supervision of high risk pregnancy, unspecified, unspecified trimester: Secondary | ICD-10-CM

## 2023-10-26 NOTE — Progress Notes (Signed)
 PRENATAL VISIT NOTE  Subjective:  Wendy Harrington is a 23 y.o. G1P0 at [redacted]w[redacted]d being seen today for ongoing prenatal care.  She is currently monitored for the following issues for this  pregnancy and has ADHD (attention deficit hyperactivity disorder); Supervision of high risk pregnancy, antepartum; BMI 50.0-59.9, adult (HCC); Depression; Maternal gonorrhea in first trimester; Alpha thalassemia silent carrier; and Maternal morbid obesity, antepartum (HCC) on their problem list.  Patient reports no bleeding, no contractions, no cramping, no leaking, and no headache, vision changes, RUQ or epigastric pain. No nausea/vomting. Supportive FOB accompanies her today. States she just woke up and has not had anything to eat yet. Has not felt baby move yet this morning..   .  .   . Denies leaking of fluid. Pt would prefer that I wait until next week to do GBS and GC/CT vaginal swabs. (CNM offered to do today)  The following portions of the patient's history were reviewed and updated as appropriate: allergies, current medications, past family history, past medical history, past social history, past surgical history and problem list.   Objective:    Vitals:   10/26/23 1127  Weight: (!) 374 lb (169.6 kg)    Fetal Status:           General: Alert, oriented and cooperative. Patient is in no acute distress.  Skin: Skin is warm and dry. No rash noted.   Cardiovascular: Normal heart rate noted  Respiratory: Normal respiratory effort, no problems with respiration noted  Abdomen: Soft, gravid, appropriate for gestational age.        Pelvic: Cervical exam deferred        Extremities: Normal range of motion.  Edema: None  Mental Status: Normal mood and affect. Normal behavior. Normal judgment and thought content.   Assessment and Plan:  Pregnancy: G1P0 at [redacted]w[redacted]d  1. Supervision of high risk pregnancy, antepartum (Primary) - on baby ASA and PNV - US  (09/24/23): Vtx, anterior placenta, AFI wnl, EFW 3lb  10oz., 1633gm, (14%), HC 3.6%, AC 12%, FL 32%. BP elevated 129/90 and 144/97.  - US  (10/16/23): Vtx, anterior placenta, AFV wnl, BPP 8/8. BP 139/93. Repeat 130/87. Continue weekly antenatal testing and growth US  q4wks. Plan delivery around EDD or sooner if indicated.  - Baseline PreE labs within normal ranges (10/16/23) - RTO in 2-3 days for Prenatal Visit and GC/CT/GBS cultures.  - Pt will continue to check BP at home and is aware of s/s PreE to report (headache, vision changes, RUQ epigastric pain, nausea/vomiting/new edema).   Babyscripts Data  10/25/23 1338 128/89 --     10/24/23 2329 117/70 --     10/24/23 2327 137/93 Abnormal  --     10/21/23 2318 121/72 --     10/20/23 1947 108/67 --     10/20/23 0118 100/64 --     10/18/23 2315 118/71 --     10/17/23 1308 112/78 --     10/13/23 1618 119/79 --     10/12/23 1603 108/72 --     10/11/23 2102 120/86 --     10/10/23 1655 137/83 --     10/09/23 1100 131/77 --     06/04/23 2016 138/88 --     05/28/23 2022 --            Preterm labor symptoms and general obstetric precautions including but not limited to vaginal bleeding, contractions, leaking of fluid and fetal movement were reviewed in detail with the patient. Please refer to After Visit Summary for  other counseling recommendations.   No follow-ups on file.  Future Appointments  Date Time Provider Department Center  10/29/2023  3:15 PM [facility]  10/30/2023 10:15 AM Alessander Sikorski, Arland POUR, CNM [facility]  11/05/2023  3:15 PM [facility] NST [facility]  11/06/2023 10:15 AM Cleotilde Ronal RAMAN, MD [facility]  11/13/2023 10:15 AM Tad, Arland POUR, CNM [facility]  11/13/2023  2:45 PM [facility] PROVIDER 1 [facility]  11/13/2023  3:00 PM [facility] US3 [facility]    Arland POUR Tad, CNM

## 2023-10-29 ENCOUNTER — Ambulatory Visit: Attending: Maternal & Fetal Medicine | Admitting: *Deleted

## 2023-10-29 DIAGNOSIS — Z3A36 36 weeks gestation of pregnancy: Secondary | ICD-10-CM | POA: Diagnosis not present

## 2023-10-29 DIAGNOSIS — O99213 Obesity complicating pregnancy, third trimester: Secondary | ICD-10-CM | POA: Diagnosis present

## 2023-10-29 NOTE — Procedures (Signed)
 Wendy Harrington 11/07/2000 [redacted]w[redacted]d  Fetus A Non-Stress Test Interpretation for 10/29/23  N[facility] only  Indication: Obesity  Fetal Heart Rate A Mode: External Baseline Rate (A): 135 bpm Variability: Moderate Accelerations: 15 x 15 Decelerations: None Multiple birth?: No  Uterine Activity Mode: Palpation, Toco Contraction Frequency (min): none Resting Tone Palpated: Relaxed  Interpretation (Fetal Testing) Nonstress Test Interpretation: Reactive Overall Impression: Reassuring for gestational age Comments: Dr. William reviewed tracing

## 2023-10-30 ENCOUNTER — Ambulatory Visit (HOSPITAL_BASED_OUTPATIENT_CLINIC_OR_DEPARTMENT_OTHER): Payer: Medicaid Other | Admitting: Certified Nurse Midwife

## 2023-10-30 ENCOUNTER — Other Ambulatory Visit (HOSPITAL_COMMUNITY)
Admission: RE | Admit: 2023-10-30 | Discharge: 2023-10-30 | Disposition: A | Discharge status: Home / Self Care | Source: Ambulatory Visit | Attending: Obstetrics & Gynecology | Admitting: Obstetrics & Gynecology

## 2023-10-30 VITALS — BP 127/85 | HR 100 | Wt 376.6 lb

## 2023-10-30 DIAGNOSIS — D563 Thalassemia minor: Secondary | ICD-10-CM | POA: Diagnosis not present

## 2023-10-30 DIAGNOSIS — O99013 Anemia complicating pregnancy, third trimester: Secondary | ICD-10-CM | POA: Diagnosis not present

## 2023-10-30 DIAGNOSIS — Z8619 Personal history of other infectious and parasitic diseases: Secondary | ICD-10-CM | POA: Diagnosis not present

## 2023-10-30 DIAGNOSIS — O98211 Gonorrhea complicating pregnancy, first trimester: Secondary | ICD-10-CM

## 2023-10-30 DIAGNOSIS — O0990 Supervision of high risk pregnancy, unspecified, unspecified trimester: Secondary | ICD-10-CM

## 2023-10-30 DIAGNOSIS — Z3A36 36 weeks gestation of pregnancy: Secondary | ICD-10-CM

## 2023-10-30 DIAGNOSIS — Z6841 Body Mass Index (BMI) 40.0 and over, adult: Secondary | ICD-10-CM

## 2023-10-30 DIAGNOSIS — F909 Attention-deficit hyperactivity disorder, unspecified type: Secondary | ICD-10-CM

## 2023-10-30 NOTE — Progress Notes (Signed)
 10:53am complete    PRENATAL VISIT NOTE  Subjective:  Wendy Harrington is a 23 y.o. G1P0 at [redacted]w[redacted]d being seen today for ongoing prenatal care.  She is currently monitored for the following issues for this high-risk pregnancy and has ADHD (attention deficit hyperactivity disorder); Supervision of high risk pregnancy, antepartum; BMI 50.0-59.9, adult (HCC); Depression; Maternal gonorrhea in first trimester; Alpha thalassemia silent carrier; and Maternal morbid obesity, antepartum (HCC) on their problem list.  Patient reports no bleeding, no contractions, and no leaking.  Contractions: Not present. Vag. Bleeding: None.  Movement: Present. Denies leaking of fluid.   The following portions of the patient's history were reviewed and updated as appropriate: allergies, current medications, past family history, past medical history, past social history, past surgical history and problem list.   Objective:    Vitals:   10/30/23 1036  BP: 127/85  Pulse: 100  Weight: (!) 376 lb 9.6 oz (170.8 kg)    Fetal Status:      Movement: Present    General: Alert, oriented and cooperative. Patient is in no acute distress.  Skin: Skin is warm and dry. No rash noted.   Cardiovascular: Normal heart rate noted  Respiratory: Normal respiratory effort, no problems with respiration noted  Abdomen: Soft, gravid, appropriate for gestational age.  Pain/Pressure: Present     Pelvic: Cervical exam performed in the presence of a chaperone Dilation: Closed Effacement (%): 0    Extremities: Normal range of motion.  Edema: None  Mental Status: Normal mood and affect. Normal behavior. Normal judgment and thought content.   Assessment and Plan:  Pregnancy: G1P0 at [redacted]w[redacted]d   1. Supervision of high risk pregnancy, antepartum (Primary) - on baby ASA and PNV - US  (09/24/23): Vtx, anterior placenta, AFI wnl, EFW 3lb 10oz., 1633gm, (14%), HC 3.6%, AC 12%, FL 32%. BP elevated 129/90 and 144/97.  - US  (10/16/23): Vtx, anterior  placenta, AFV wnl, BPP 8/8. BP 139/93. Repeat 130/87. Continue weekly antenatal testing and growth US  q4wks. Plan delivery around EDD or sooner if indicated.  - Baseline PreE labs within normal ranges (10/16/23) - Pt will continue to check BP at home and is aware of s/s PreE to report (headache, vision changes, RUQ epigastric pain, nausea/vomiting/new edema).    Babyscripts Data 10/28/23 1654 102/66 --     10/27/23 1701 120/72 --     10/25/23 1338 128/89 --     10/24/23 2329 117/70 --     10/24/23 2327 137/93 Abnormal  --     10/21/23 2318 121/72 --      2. [redacted] weeks gestation of pregnancy   3. PreGravid BMI 50.0-59.9, adult (HCC)   4. Alpha thalassemia silent carrier   5. History of maternal gonorrhea, currently pregnant - testing was positive x 2 and she was treated x 2.  TOC negative 5/27.  Repeat today (routine)  Term labor symptoms and general obstetric precautions including but not limited to vaginal bleeding, contractions, leaking of fluid and fetal movement were reviewed in detail with the patient. Please refer to After Visit Summary for other counseling recommendations.    Future Appointments  Date Time Provider Department Center  11/05/2023  3:15 PM [facility] NST [facility]  11/06/2023 10:15 AM Cleotilde Ronal RAMAN, MD [facility]  11/13/2023 10:15 AM Tad, Arland POUR, CNM [facility]  11/13/2023  2:45 PM [facility] PROVIDER 1 [facility]  11/13/2023  3:00 PM [facility] US3 [facility]    Arland POUR Tad, CNM

## 2023-10-30 NOTE — Addendum Note (Signed)
 Addended by: DARRYLE PRESIDENT B on: 10/30/2023 11:38 AM   Modules accepted: Orders

## 2023-11-01 LAB — CERVICOVAGINAL ANCILLARY ONLY
Chlamydia: NEGATIVE
Comment: NEGATIVE
Comment: NEGATIVE
Comment: NORMAL
Neisseria Gonorrhea: NEGATIVE
Trichomonas: NEGATIVE

## 2023-11-02 ENCOUNTER — Other Ambulatory Visit (HOSPITAL_BASED_OUTPATIENT_CLINIC_OR_DEPARTMENT_OTHER): Payer: Self-pay | Admitting: Certified Nurse Midwife

## 2023-11-02 DIAGNOSIS — O0990 Supervision of high risk pregnancy, unspecified, unspecified trimester: Secondary | ICD-10-CM

## 2023-11-02 LAB — CULTURE, BETA STREP (GROUP B ONLY): Strep Gp B Culture: POSITIVE — AB

## 2023-11-05 ENCOUNTER — Ambulatory Visit (HOSPITAL_BASED_OUTPATIENT_CLINIC_OR_DEPARTMENT_OTHER): Payer: Self-pay | Admitting: Certified Nurse Midwife

## 2023-11-05 ENCOUNTER — Ambulatory Visit: Attending: Maternal & Fetal Medicine | Admitting: *Deleted

## 2023-11-05 DIAGNOSIS — Z3A37 37 weeks gestation of pregnancy: Secondary | ICD-10-CM | POA: Insufficient documentation

## 2023-11-05 DIAGNOSIS — O99210 Obesity complicating pregnancy, unspecified trimester: Secondary | ICD-10-CM | POA: Insufficient documentation

## 2023-11-05 DIAGNOSIS — O99213 Obesity complicating pregnancy, third trimester: Secondary | ICD-10-CM | POA: Diagnosis not present

## 2023-11-05 NOTE — Procedures (Signed)
 Wendy Harrington [birth year] [redacted]w[redacted]d  Fetus A Non-Stress Test Interpretation for 11/05/23  NST only  Indication: Morbid Obesity  Fetal Heart Rate A Mode: External Baseline Rate (A): 140 bpm Variability: Moderate Accelerations: 15 x 15 Decelerations: None Multiple birth?: No  Uterine Activity Mode: Palpation, Toco Contraction Frequency (min): none Resting Tone Palpated: Relaxed  Interpretation (Fetal Testing) Nonstress Test Interpretation: Reactive Overall Impression: Reassuring for gestational age Comments: Dr. William reviewed tracing

## 2023-11-06 ENCOUNTER — Telehealth (HOSPITAL_BASED_OUTPATIENT_CLINIC_OR_DEPARTMENT_OTHER): Payer: Self-pay

## 2023-11-06 ENCOUNTER — Ambulatory Visit (INDEPENDENT_AMBULATORY_CARE_PROVIDER_SITE_OTHER): Payer: Medicaid Other | Admitting: Certified Nurse Midwife

## 2023-11-06 VITALS — BP 132/86 | HR 92 | Wt 379.4 lb

## 2023-11-06 DIAGNOSIS — Z6841 Body Mass Index (BMI) 40.0 and over, adult: Secondary | ICD-10-CM

## 2023-11-06 DIAGNOSIS — O0993 Supervision of high risk pregnancy, unspecified, third trimester: Secondary | ICD-10-CM | POA: Diagnosis not present

## 2023-11-06 DIAGNOSIS — O98213 Gonorrhea complicating pregnancy, third trimester: Secondary | ICD-10-CM

## 2023-11-06 DIAGNOSIS — Z3A38 38 weeks gestation of pregnancy: Secondary | ICD-10-CM

## 2023-11-06 DIAGNOSIS — O98211 Gonorrhea complicating pregnancy, first trimester: Secondary | ICD-10-CM

## 2023-11-06 DIAGNOSIS — O99213 Obesity complicating pregnancy, third trimester: Secondary | ICD-10-CM

## 2023-11-06 DIAGNOSIS — O0990 Supervision of high risk pregnancy, unspecified, unspecified trimester: Secondary | ICD-10-CM

## 2023-11-06 NOTE — Telephone Encounter (Signed)
 Spoke with patient. Advised induction has been scheduled for 11/14/23 at 7:15 am. She will be contacted by the hospital regarding induction information. Patient verbalizes understanding.  Delivery scheduling form complete and faxed with confirmation to HIM [phone].

## 2023-11-06 NOTE — Progress Notes (Unsigned)
[id number]

## 2023-11-07 NOTE — Progress Notes (Signed)
 PRENATAL VISIT NOTE  Subjective:  Wendy Harrington is a 23 y.o. G1P0 at [redacted]w[redacted]d being seen today for ongoing prenatal care.  She is currently monitored for the following issues for this  pregnancy and has ADHD (attention deficit hyperactivity disorder); Supervision of high risk pregnancy, antepartum; BMI 50.0-59.9, adult (HCC); Depression; Maternal gonorrhea in first trimester; Alpha thalassemia silent carrier; and Maternal morbid obesity, antepartum (HCC) on their problem list.  Patient reports no bleeding, no contractions, no cramping, and no leaking.  Contractions: Not present. Vag. Bleeding: None.  Movement: Present. Denies leaking of fluid.   The following portions of the patient's history were reviewed and updated as appropriate: allergies, current medications, past family history, past medical history, past social history, past surgical history and problem list.   Objective:    Vitals:   11/06/23 1029  BP: 132/86  Pulse: 92  Weight: (!) 379 lb 6.4 oz (172.1 kg)    Fetal Status:  Fetal Heart Rate (bpm): 140   Movement: Present    General: Alert, oriented and cooperative. Patient is in no acute distress.  Skin: Skin is warm and dry. No rash noted.   Cardiovascular: Normal heart rate noted  Respiratory: Normal respiratory effort, no problems with respiration noted  Abdomen: Soft, gravid, appropriate for gestational age.  Pain/Pressure: Present (Pressure)     Pelvic: Cervical exam deferred        Extremities: Normal range of motion.  Edema: None  Mental Status: Normal mood and affect. Normal behavior. Normal judgment and thought content.   Assessment and Plan:  Pregnancy: G1P0 at [redacted]w[redacted]d  1. Supervision of high risk pregnancy, antepartum (Primary) - on baby ASA and PNV - US  (09/24/23): Vtx, anterior placenta, AFI wnl, EFW 3lb 10oz., 1633gm, (14%), HC 3.6%, AC 12%, FL 32%. BP elevated 129/90 and 144/97.  - US  (10/16/23): Vtx, anterior placenta, AFV wnl, BPP 8/8. BP 139/93. Repeat  130/87. Continue weekly antenatal testing and growth US  q4wks. Plan delivery around EDD or sooner if indicated.  - US  (10/22/23): Vtx, anterior placenta, EFW 5lb 2oz (11%), HC <1%, AC 18%, FL 25%. AFV wnl. BPP 8/8. Weekly antenatal testing and delivery around 39 weeks (IOL scheduled). - Baseline PreE labs within normal ranges (10/16/23) - Pt will continue to check BP at home and is aware of s/s PreE to report (headache, vision changes, RUQ epigastric pain, nausea/vomiting/new edema).    Babyscripts Data 10/28/23 1654 102/66 --        10/27/23 1701 120/72 --        10/25/23 1338 128/89 --        10/24/23 2329 117/70 --        10/24/23 2327 137/93 Abnormal  --        10/21/23 2318 121/72 --          2. [redacted] weeks gestation of pregnancy   3. PreGravid BMI 50.0-59.9, adult (HCC)   4. Alpha thalassemia silent carrier   5. History of maternal gonorrhea, currently pregnant - testing was positive x 2 and she was treated x 2.  TOC negative 5/27.     Term labor symptoms and general obstetric precautions including but not limited to vaginal bleeding, contractions, leaking of fluid and fetal movement were reviewed in detail with the patient. Please refer to After Visit Summary for other counseling recommendations.    Future Appointments  Date Time Provider Department Center  11/13/2023 10:15 AM Tad Arland POUR, CNM [facility]  11/13/2023  2:45 PM [facility] PROVIDER 1  [facility]  11/13/2023  3:00 PM [facility] US3 [facility]  11/14/2023  7:15 AM [facility] SCHED ROOM [facility] None    Arland MARLA Roller, CNM

## 2023-11-11 ENCOUNTER — Inpatient Hospital Stay (HOSPITAL_COMMUNITY): Admitting: Anesthesiology

## 2023-11-11 ENCOUNTER — Encounter (HOSPITAL_COMMUNITY): Payer: Self-pay | Admitting: Obstetrics and Gynecology

## 2023-11-11 ENCOUNTER — Inpatient Hospital Stay (HOSPITAL_COMMUNITY)
Admission: AD | Admit: 2023-11-11 | Discharge: 2023-11-13 | DRG: 768 | Disposition: A | Discharge status: Home / Self Care | Attending: Obstetrics and Gynecology | Admitting: Obstetrics and Gynecology

## 2023-11-11 ENCOUNTER — Other Ambulatory Visit: Payer: Self-pay

## 2023-11-11 DIAGNOSIS — Z880 Allergy status to penicillin: Secondary | ICD-10-CM | POA: Diagnosis not present

## 2023-11-11 DIAGNOSIS — Z6841 Body Mass Index (BMI) 40.0 and over, adult: Secondary | ICD-10-CM

## 2023-11-11 DIAGNOSIS — Z8249 Family history of ischemic heart disease and other diseases of the circulatory system: Secondary | ICD-10-CM | POA: Diagnosis not present

## 2023-11-11 DIAGNOSIS — D563 Thalassemia minor: Secondary | ICD-10-CM | POA: Diagnosis present

## 2023-11-11 DIAGNOSIS — O134 Gestational [pregnancy-induced] hypertension without significant proteinuria, complicating childbirth: Secondary | ICD-10-CM | POA: Diagnosis present

## 2023-11-11 DIAGNOSIS — O4202 Full-term premature rupture of membranes, onset of labor within 24 hours of rupture: Secondary | ICD-10-CM | POA: Diagnosis not present

## 2023-11-11 DIAGNOSIS — O99214 Obesity complicating childbirth: Secondary | ICD-10-CM | POA: Diagnosis present

## 2023-11-11 DIAGNOSIS — O4292 Full-term premature rupture of membranes, unspecified as to length of time between rupture and onset of labor: Secondary | ICD-10-CM | POA: Diagnosis present

## 2023-11-11 DIAGNOSIS — Z7982 Long term (current) use of aspirin: Secondary | ICD-10-CM | POA: Diagnosis not present

## 2023-11-11 DIAGNOSIS — Z833 Family history of diabetes mellitus: Secondary | ICD-10-CM | POA: Diagnosis not present

## 2023-11-11 DIAGNOSIS — O99820 Streptococcus B carrier state complicating pregnancy: Secondary | ICD-10-CM | POA: Diagnosis not present

## 2023-11-11 DIAGNOSIS — O26893 Other specified pregnancy related conditions, third trimester: Secondary | ICD-10-CM | POA: Diagnosis present

## 2023-11-11 DIAGNOSIS — Z3A38 38 weeks gestation of pregnancy: Secondary | ICD-10-CM | POA: Diagnosis not present

## 2023-11-11 DIAGNOSIS — O99344 Other mental disorders complicating childbirth: Secondary | ICD-10-CM | POA: Diagnosis not present

## 2023-11-11 DIAGNOSIS — Z148 Genetic carrier of other disease: Secondary | ICD-10-CM

## 2023-11-11 DIAGNOSIS — O99824 Streptococcus B carrier state complicating childbirth: Secondary | ICD-10-CM | POA: Diagnosis present

## 2023-11-11 DIAGNOSIS — F32A Depression, unspecified: Secondary | ICD-10-CM | POA: Diagnosis present

## 2023-11-11 DIAGNOSIS — O0990 Supervision of high risk pregnancy, unspecified, unspecified trimester: Secondary | ICD-10-CM

## 2023-11-11 DIAGNOSIS — O98211 Gonorrhea complicating pregnancy, first trimester: Secondary | ICD-10-CM | POA: Diagnosis present

## 2023-11-11 DIAGNOSIS — O139 Gestational [pregnancy-induced] hypertension without significant proteinuria, unspecified trimester: Secondary | ICD-10-CM | POA: Diagnosis not present

## 2023-11-11 LAB — PROTEIN / CREATININE RATIO, URINE
Creatinine, Urine: 121 mg/dL
Protein Creatinine Ratio: 0.23 mg/mg{creat} — ABNORMAL HIGH (ref 0.00–0.15)
Total Protein, Urine: 28 mg/dL

## 2023-11-11 LAB — TYPE AND SCREEN
ABO/RH(D): A POS
Antibody Screen: NEGATIVE

## 2023-11-11 LAB — COMPREHENSIVE METABOLIC PANEL WITH GFR
ALT: 14 U/L (ref 0–44)
AST: 17 U/L (ref 15–41)
Albumin: 2.6 g/dL — ABNORMAL LOW (ref 3.5–5.0)
Alkaline Phosphatase: 92 U/L (ref 38–126)
Anion gap: 7 (ref 5–15)
BUN: 8 mg/dL (ref 6–20)
CO2: 22 mmol/L (ref 22–32)
Calcium: 9.3 mg/dL (ref 8.9–10.3)
Chloride: 107 mmol/L (ref 98–111)
Creatinine, Ser: 0.47 mg/dL (ref 0.44–1.00)
GFR, Estimated: >60 mL/min (ref >60)
Glucose, Bld: 83 mg/dL (ref 70–99)
Potassium: 3.6 mmol/L (ref 3.5–5.1)
Sodium: 136 mmol/L (ref 135–145)
Total Bilirubin: 0.4 mg/dL (ref 0.0–1.2)
Total Protein: 7.3 g/dL (ref 6.5–8.1)

## 2023-11-11 LAB — CBC
HCT: 37.3 % (ref 36.0–46.0)
HCT: 36.4 % (ref 36.0–46.0)
Hemoglobin: 12.2 g/dL (ref 12.0–15.0)
Hemoglobin: 12.1 g/dL (ref 12.0–15.0)
MCH: 27.5 pg (ref 26.0–34.0)
MCH: 27.8 pg (ref 26.0–34.0)
MCHC: 32.7 g/dL (ref 30.0–36.0)
MCHC: 33.2 g/dL (ref 30.0–36.0)
MCV: 84.2 fL (ref 80.0–100.0)
MCV: 83.5 fL (ref 80.0–100.0)
Platelets: 345 K/uL (ref 150–400)
Platelets: 351 K/uL (ref 150–400)
RBC: 4.43 MIL/uL (ref 3.87–5.11)
RBC: 4.36 MIL/uL (ref 3.87–5.11)
RDW: 15.8 % — ABNORMAL HIGH (ref 11.5–15.5)
RDW: 15.7 % — ABNORMAL HIGH (ref 11.5–15.5)
WBC: 13.6 K/uL — ABNORMAL HIGH (ref 4.0–10.5)
WBC: 14.9 K/uL — ABNORMAL HIGH (ref 4.0–10.5)
nRBC: 0 % (ref 0.0–0.2)
nRBC: 0 % (ref 0.0–0.2)

## 2023-11-11 LAB — RPR: RPR Ser Ql: NONREACTIVE

## 2023-11-11 LAB — POCT FERN TEST: POCT Fern Test: POSITIVE — AB

## 2023-11-11 MED ORDER — LABETALOL HCL 5 MG/ML IV SOLN: 80.0000 mg | INTRAVENOUS | Status: DC | PRN

## 2023-11-11 MED ORDER — LABETALOL HCL 5 MG/ML IV SOLN: 40.0000 mg | INTRAVENOUS | Status: DC | PRN

## 2023-11-11 MED ORDER — ONDANSETRON HCL 4 MG/2ML IJ SOLN: 4.0000 mg | Freq: Four times a day (QID) | INTRAMUSCULAR | Status: DC | PRN

## 2023-11-11 MED ORDER — LABETALOL HCL 200 MG PO TABS
200.0000 mg | ORAL_TABLET | Freq: Two times a day (BID) | ORAL | Status: DC
  Administered 2023-11-11: 200 mg via ORAL
  Filled 2023-11-11: qty 1

## 2023-11-11 MED ORDER — LACTATED RINGERS IV SOLN: 500.0000 mL | Freq: Once | INTRAVENOUS | Status: DC

## 2023-11-11 MED ORDER — PHENYLEPHRINE 80 MCG/ML (10ML) SYRINGE FOR IV PUSH (FOR BLOOD PRESSURE SUPPORT): 80.0000 ug | PREFILLED_SYRINGE | INTRAVENOUS | Status: DC | PRN

## 2023-11-11 MED ORDER — LIDOCAINE HCL (PF) 1 % IJ SOLN
30.0000 mL | INTRAMUSCULAR | Status: DC | PRN
Start: 2023-11-11 — End: 2023-11-12

## 2023-11-11 MED ORDER — SODIUM CHLORIDE 0.9 % IV SOLN
5.0000 10*6.[IU] | Freq: Once | INTRAVENOUS | Status: AC
  Administered 2023-11-11: 5 10*6.[IU] via INTRAVENOUS
  Filled 2023-11-11: qty 5

## 2023-11-11 MED ORDER — LABETALOL HCL 200 MG PO TABS: 200.0000 mg | ORAL_TABLET | Freq: Two times a day (BID) | ORAL | Status: DC

## 2023-11-11 MED ORDER — SOD CITRATE-CITRIC ACID 500-334 MG/5ML PO SOLN: 30.0000 mL | ORAL | Status: DC | PRN

## 2023-11-11 MED ORDER — OXYTOCIN-SODIUM CHLORIDE 30-0.9 UT/500ML-% IV SOLN
2.5000 [IU]/h | INTRAVENOUS | Status: DC
  Filled 2023-11-11: qty 500

## 2023-11-11 MED ORDER — LIDOCAINE-EPINEPHRINE (PF) 1.5 %-1:200000 IJ SOLN
INTRAMUSCULAR | Status: DC | PRN
Start: 2023-11-11 — End: 2023-11-12
  Administered 2023-11-11: 5 mL via EPIDURAL

## 2023-11-11 MED ORDER — EPHEDRINE 5 MG/ML INJ: 10.0000 mg | INTRAVENOUS | Status: DC | PRN

## 2023-11-11 MED ORDER — OXYTOCIN BOLUS FROM INFUSION
333.0000 mL | Freq: Once | INTRAVENOUS | Status: AC
  Administered 2023-11-12: 333 mL via INTRAVENOUS

## 2023-11-11 MED ORDER — DIPHENHYDRAMINE HCL 50 MG/ML IJ SOLN: 12.5000 mg | INTRAMUSCULAR | Status: DC | PRN

## 2023-11-11 MED ORDER — PENICILLIN G POT IN DEXTROSE 60000 UNIT/ML IV SOLN
3.0000 10*6.[IU] | INTRAVENOUS | Status: DC
  Administered 2023-11-11 (×3): 3 10*6.[IU] via INTRAVENOUS
  Filled 2023-11-11 (×3): qty 50

## 2023-11-11 MED ORDER — OXYTOCIN-SODIUM CHLORIDE 30-0.9 UT/500ML-% IV SOLN
1.0000 m[IU]/min | INTRAVENOUS | Status: DC
  Administered 2023-11-11: 2 m[IU]/min via INTRAVENOUS

## 2023-11-11 MED ORDER — OXYCODONE-ACETAMINOPHEN 5-325 MG PO TABS: 2.0000 | ORAL_TABLET | ORAL | Status: DC | PRN

## 2023-11-11 MED ORDER — HYDRALAZINE HCL 20 MG/ML IJ SOLN: 10.0000 mg | INTRAMUSCULAR | Status: DC | PRN

## 2023-11-11 MED ORDER — OXYCODONE-ACETAMINOPHEN 5-325 MG PO TABS: 1.0000 | ORAL_TABLET | ORAL | Status: DC | PRN

## 2023-11-11 MED ORDER — FENTANYL-BUPIVACAINE-NACL 0.5-0.125-0.9 MG/250ML-% EP SOLN
12.0000 mL/h | EPIDURAL | Status: DC | PRN
  Administered 2023-11-11: 12 mL/h via EPIDURAL
  Filled 2023-11-11: qty 250

## 2023-11-11 MED ORDER — LACTATED RINGERS IV SOLN: INTRAVENOUS | Status: DC

## 2023-11-11 MED ORDER — LACTATED RINGERS IV SOLN: 500.0000 mL | INTRAVENOUS | Status: DC | PRN

## 2023-11-11 MED ORDER — LABETALOL HCL 5 MG/ML IV SOLN: 20.0000 mg | INTRAVENOUS | Status: DC | PRN

## 2023-11-11 MED ORDER — TERBUTALINE SULFATE 1 MG/ML IJ SOLN: 0.2500 mg | Freq: Once | INTRAMUSCULAR | Status: DC | PRN

## 2023-11-11 MED ORDER — FENTANYL CITRATE (PF) 100 MCG/2ML IJ SOLN
50.0000 ug | INTRAMUSCULAR | Status: DC | PRN
  Administered 2023-11-11 (×3): 100 ug via INTRAVENOUS
  Filled 2023-11-11 (×3): qty 2

## 2023-11-11 MED ORDER — ACETAMINOPHEN 325 MG PO TABS: 650.0000 mg | ORAL_TABLET | ORAL | Status: DC | PRN

## 2023-11-11 NOTE — MAU Note (Signed)
 Wendy Harrington is a 23 y.o. at [redacted]w[redacted]d here in MAU reporting: Water broke at 0400. She reports small gush of clear fluid that has continued to leak.  Patient reports contraction every 5 minutes since 0400.  Reports decreased fetal movement since her water broke.  Denies vaginal bleeding   Onset of complaint: today 0400 Pain score: 8/10 abdomen and back with ctx  Vitals:   11/11/23 0747  BP: (!) 153/106  Pulse: 99  Resp: 20  Temp: 98.5 F (36.9 C)  SpO2: 100%     Lab orders placed from triage:  none

## 2023-11-11 NOTE — MAU Note (Signed)
 Bedside US  performed, vtx presentation confirmed by RN and Dr. Jhonny

## 2023-11-11 NOTE — Anesthesia Preprocedure Evaluation (Signed)
 Anesthesia Evaluation  Patient identified by MRN, date of birth, ID band Patient awake    Reviewed: Allergy & Precautions, NPO status , Patient's Chart, lab work & pertinent test results  Airway Mallampati: III  TM Distance: >3 FB Neck ROM: Full    Dental no notable dental hx.    Pulmonary sleep apnea    Pulmonary exam normal        Cardiovascular negative cardio ROS  Rhythm:Regular Rate:Normal     Neuro/Psych    Depression    negative neurological ROS     GI/Hepatic negative GI ROS, Neg liver ROS,,,  Endo/Other  negative endocrine ROS  Class 4 obesity  Renal/GU   negative genitourinary   Musculoskeletal negative musculoskeletal ROS (+)    Abdominal  (+) + obese  Peds  Hematology Lab Results      Component                Value               Date                      WBC                      14.9 (H)            11/11/2023                HGB                      12.1                11/11/2023                HCT                      36.4                11/11/2023                MCV                      83.5                11/11/2023                PLT                      351                 11/11/2023             Lab Results      Component                Value               Date                      NA                       136                 11/11/2023                K  3.6                 11/11/2023                CO2                      22                  11/11/2023                GLUCOSE                  83                  11/11/2023                BUN                      8                   11/11/2023                CREATININE               0.47                11/11/2023                CALCIUM                  9.3                 11/11/2023                EGFR                     143                 10/16/2023                GFRNONAA                 >60                 11/11/2023               Anesthesia Other Findings   Reproductive/Obstetrics (+) Pregnancy                              Anesthesia Physical Anesthesia Plan  ASA: 3  Anesthesia Plan: Epidural   Post-op Pain Management:    Induction:   PONV Risk Score and Plan: 2 and Treatment may vary due to age or medical condition  Airway Management Planned: Natural Airway  Additional Equipment: None  Intra-op Plan:   Post-operative Plan:   Informed Consent: I have reviewed the patients History and Physical, chart, labs and discussed the procedure including the risks, benefits and alternatives for the proposed anesthesia with the patient or authorized representative who has indicated his/her understanding and acceptance.     Dental advisory given  Plan Discussed with:   Anesthesia Plan Comments:         Anesthesia Quick Evaluation

## 2023-11-11 NOTE — Anesthesia Procedure Notes (Signed)
 Epidural Patient location during procedure: OB Start time: 11/11/2023 5:00 PM End time: 11/11/2023 5:08 PM  Staffing Anesthesiologist: Dorethea Cordella SQUIBB, DO Performed: anesthesiologist   Preanesthetic Checklist Completed: patient identified, IV checked, site marked, risks and benefits discussed, surgical consent, monitors and equipment checked, pre-op evaluation and timeout performed  Epidural Patient position: sitting Prep: ChloraPrep Patient monitoring: heart rate, continuous pulse ox and blood pressure Approach: midline Location: L4-L5 Injection technique: LOR saline  Needle:  Needle type: Tuohy  Needle gauge: 17 G Needle length: 9 cm Needle insertion depth: 9 cm Catheter type: closed end flexible Catheter size: 20 Guage Catheter at skin depth: 15 cm Test dose: negative and 1.5% lidocaine  with Epi 1:200 K  Assessment Events: blood not aspirated, no cerebrospinal fluid, injection not painful, no injection resistance and no paresthesia  Additional Notes Patient identified. Risks/Benefits/Options discussed with patient including but not limited to bleeding, infection, nerve damage, paralysis, failed block, incomplete pain control, headache, blood pressure changes, nausea, vomiting, reactions to medications, itching and postpartum back pain. Confirmed with bedside nurse the patient's most recent platelet count. Confirmed with patient that they are not currently taking any anticoagulation, have any bleeding history or any family history of bleeding disorders. Patient expressed understanding and wished to proceed. All questions were answered. Sterile technique was used throughout the entire procedure. Please see nursing notes for vital signs. Test dose was given through epidural catheter and negative prior to continuing to dose epidural or start infusion. Warning signs of high block given to the patient including shortness of breath, tingling/numbness in hands, complete motor block,  or any concerning symptoms with instructions to call for help. Patient was given instructions on fall risk and not to get out of bed. All questions and concerns addressed with instructions to call with any issues or inadequate analgesia.    Reason for block:procedure for pain

## 2023-11-11 NOTE — Progress Notes (Signed)
 Labor Progress Note Wendy Harrington is a 23 y.o. G1P0 at [redacted]w[redacted]d presented for SOL S: Feels contractions are stronger. Would like epidural  O:  BP (!) 141/98   Pulse 90   Temp 97.8 F (36.6 C) (Oral)   Resp 19   Wt (!) 174.2 kg   LMP 02/03/2023   SpO2 100%   BMI 69.14 kg/m  EFM: 135 baseline/moderate variability/no accels, no decels  CVE: Dilation: 3 Effacement (%): 90 Station: -1 Presentation: Vertex Exam by:: Ellisa Devivo MD   A&P: 23 y.o. G1P0 [redacted]w[redacted]d admitted for SOL #Labor: Progressing well. Will start pitocin  after she received epidural #Pain: epidural planned #FWB: category 1  #GBS positive   Coolidge Moros, MD 4:25 PM
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
## 2023-11-11 NOTE — Progress Notes (Cosign Needed Addendum)
 Labor Progress Note Wendy Harrington is a 23 y.o. G1P0 at [redacted]w[redacted]d by LMP presented for  SOL/SROM. With PMHx of gonorrhea in first trimester; negative TOC, GBS + and BMI 69. BP on admission up to 167/106 and pt started on labetalol . Protein Cr ratio 0.23, platelets and LFT's wnl. Pt w/o HA or vision changes. Hgb 11.7 on admission.  S: Patient is more comfortable with epidural. Without new sx.  O:  BP 124/75   Pulse (!) 102   Temp 98 F (36.7 C) (Oral)   Resp 19   Wt (!) 174.2 kg   LMP 02/03/2023   SpO2 100%   BMI 69.14 kg/m  EFM: 140/moderate variability/no accels/variable, early deccels  CVE: Dilation: 5.5 Effacement (%): 90 Station: 0 Presentation: Vertex Exam by:: Shana MD   A&P: 23 y.o. G1P0 [redacted]w[redacted]d SOL/SROM #Labor: Progressing well. IUPC and FSE in place. Pt on pitocin  rate of 4. Making good change. #Pain: Epidural in place #FWB: Cat 2 tracing, overall still reassuring; will continue pitocin  as tolerated #GBS positive #gHTN: diagnosed on admission, continue to monitor BP's and administer labetalol  200mg  BID. Start Lasix  and potassium postpartum per protocol.   Buel Shana, MD 9:19 PM

## 2023-11-12 ENCOUNTER — Encounter (HOSPITAL_BASED_OUTPATIENT_CLINIC_OR_DEPARTMENT_OTHER): Payer: Self-pay

## 2023-11-12 ENCOUNTER — Encounter (HOSPITAL_COMMUNITY): Payer: Self-pay | Admitting: Obstetrics and Gynecology

## 2023-11-12 DIAGNOSIS — O99344 Other mental disorders complicating childbirth: Secondary | ICD-10-CM

## 2023-11-12 DIAGNOSIS — O4202 Full-term premature rupture of membranes, onset of labor within 24 hours of rupture: Secondary | ICD-10-CM

## 2023-11-12 DIAGNOSIS — O139 Gestational [pregnancy-induced] hypertension without significant proteinuria, unspecified trimester: Secondary | ICD-10-CM | POA: Diagnosis not present

## 2023-11-12 DIAGNOSIS — O134 Gestational [pregnancy-induced] hypertension without significant proteinuria, complicating childbirth: Secondary | ICD-10-CM

## 2023-11-12 DIAGNOSIS — O99214 Obesity complicating childbirth: Secondary | ICD-10-CM

## 2023-11-12 DIAGNOSIS — Z3A38 38 weeks gestation of pregnancy: Secondary | ICD-10-CM

## 2023-11-12 DIAGNOSIS — O99820 Streptococcus B carrier state complicating pregnancy: Secondary | ICD-10-CM

## 2023-11-12 LAB — CBC
HCT: 33.3 % — ABNORMAL LOW (ref 36.0–46.0)
HCT: 28.3 % — ABNORMAL LOW (ref 36.0–46.0)
Hemoglobin: 11.3 g/dL — ABNORMAL LOW (ref 12.0–15.0)
Hemoglobin: 9.7 g/dL — ABNORMAL LOW (ref 12.0–15.0)
MCH: 28 pg (ref 26.0–34.0)
MCH: 28.4 pg (ref 26.0–34.0)
MCHC: 33.9 g/dL (ref 30.0–36.0)
MCHC: 34.3 g/dL (ref 30.0–36.0)
MCV: 82.4 fL (ref 80.0–100.0)
MCV: 82.7 fL (ref 80.0–100.0)
Platelets: 315 K/uL (ref 150–400)
Platelets: 285 K/uL (ref 150–400)
RBC: 4.04 MIL/uL (ref 3.87–5.11)
RBC: 3.42 MIL/uL — ABNORMAL LOW (ref 3.87–5.11)
RDW: 15.7 % — ABNORMAL HIGH (ref 11.5–15.5)
RDW: 15.9 % — ABNORMAL HIGH (ref 11.5–15.5)
WBC: 20.2 K/uL — ABNORMAL HIGH (ref 4.0–10.5)
WBC: 16.4 K/uL — ABNORMAL HIGH (ref 4.0–10.5)
nRBC: 0 % (ref 0.0–0.2)
nRBC: 0 % (ref 0.0–0.2)

## 2023-11-12 LAB — ABO/RH: ABO/RH(D): A POS

## 2023-11-12 MED ORDER — OXYCODONE HCL 5 MG PO TABS: 5.0000 mg | ORAL_TABLET | ORAL | Status: DC | PRN

## 2023-11-12 MED ORDER — FUROSEMIDE 20 MG PO TABS
20.0000 mg | ORAL_TABLET | Freq: Every day | ORAL | Status: DC
  Administered 2023-11-13: 20 mg via ORAL
  Filled 2023-11-12: qty 1

## 2023-11-12 MED ORDER — OXYCODONE HCL 5 MG PO TABS: 10.0000 mg | ORAL_TABLET | ORAL | Status: DC | PRN

## 2023-11-12 MED ORDER — METHYLERGONOVINE MALEATE 0.2 MG/ML IJ SOLN
INTRAMUSCULAR | Status: AC
  Filled 2023-11-12: qty 3

## 2023-11-12 MED ORDER — COCONUT OIL OIL: 1.0000 | TOPICAL_OIL | Status: DC | PRN

## 2023-11-12 MED ORDER — IBUPROFEN 600 MG PO TABS
600.0000 mg | ORAL_TABLET | Freq: Four times a day (QID) | ORAL | Status: DC
  Administered 2023-11-12 – 2023-11-13 (×6): 600 mg via ORAL
  Filled 2023-11-12 (×6): qty 1

## 2023-11-12 MED ORDER — TRANEXAMIC ACID-NACL 1000-0.7 MG/100ML-% IV SOLN
INTRAVENOUS | Status: AC
  Administered 2023-11-12: 1000 mg
  Filled 2023-11-12: qty 100

## 2023-11-12 MED ORDER — ZOLPIDEM TARTRATE 5 MG PO TABS: 5.0000 mg | ORAL_TABLET | Freq: Every evening | ORAL | Status: DC | PRN

## 2023-11-12 MED ORDER — FENTANYL CITRATE (PF) 100 MCG/2ML IJ SOLN
INTRAMUSCULAR | Status: AC
  Administered 2023-11-12: 150 ug
  Filled 2023-11-12: qty 4

## 2023-11-12 MED ORDER — OXYTOCIN-SODIUM CHLORIDE 30-0.9 UT/500ML-% IV SOLN
INTRAVENOUS | Status: AC
  Filled 2023-11-12: qty 500

## 2023-11-12 MED ORDER — ONDANSETRON HCL 4 MG PO TABS: 4.0000 mg | ORAL_TABLET | ORAL | Status: DC | PRN

## 2023-11-12 MED ORDER — PRENATAL MULTIVITAMIN CH
1.0000 | ORAL_TABLET | Freq: Every day | ORAL | Status: DC
  Administered 2023-11-12 – 2023-11-13 (×2): 1 via ORAL
  Filled 2023-11-12 (×2): qty 1

## 2023-11-12 MED ORDER — MISOPROSTOL 200 MCG PO TABS
ORAL_TABLET | ORAL | Status: AC
  Administered 2023-11-12: 1000 ug
  Filled 2023-11-12: qty 5

## 2023-11-12 MED ORDER — POTASSIUM CHLORIDE CRYS ER 20 MEQ PO TBCR
20.0000 meq | EXTENDED_RELEASE_TABLET | Freq: Every day | ORAL | Status: DC
  Administered 2023-11-13: 20 meq via ORAL
  Filled 2023-11-12: qty 1

## 2023-11-12 MED ORDER — DIPHENHYDRAMINE HCL 25 MG PO CAPS: 25.0000 mg | ORAL_CAPSULE | Freq: Four times a day (QID) | ORAL | Status: DC | PRN

## 2023-11-12 MED ORDER — ONDANSETRON HCL 4 MG/2ML IJ SOLN
4.0000 mg | INTRAMUSCULAR | Status: DC | PRN
Start: 2023-11-12 — End: 2023-11-13

## 2023-11-12 MED ORDER — BENZOCAINE-MENTHOL 20-0.5 % EX AERO: 1.0000 | INHALATION_SPRAY | CUTANEOUS | Status: DC | PRN

## 2023-11-12 MED ORDER — CARBOPROST TROMETHAMINE 250 MCG/ML IM SOLN
INTRAMUSCULAR | Status: AC
  Filled 2023-11-12: qty 1

## 2023-11-12 MED ORDER — DIBUCAINE (PERIANAL) 1 % EX OINT: 1.0000 | TOPICAL_OINTMENT | CUTANEOUS | Status: DC | PRN

## 2023-11-12 MED ORDER — SENNOSIDES-DOCUSATE SODIUM 8.6-50 MG PO TABS
2.0000 | ORAL_TABLET | Freq: Every day | ORAL | Status: DC
  Administered 2023-11-13: 2 via ORAL
  Filled 2023-11-12: qty 2

## 2023-11-12 MED ORDER — CEFAZOLIN SODIUM-DEXTROSE 3-4 GM/150ML-% IV SOLN
3.0000 g | Freq: Once | INTRAVENOUS | Status: AC
  Administered 2023-11-12: 3 g via INTRAVENOUS
  Filled 2023-11-12: qty 150

## 2023-11-12 MED ORDER — AMLODIPINE BESYLATE 5 MG PO TABS
5.0000 mg | ORAL_TABLET | Freq: Every day | ORAL | Status: DC
  Filled 2023-11-12: qty 1

## 2023-11-12 MED ORDER — ACETAMINOPHEN 325 MG PO TABS: 650.0000 mg | ORAL_TABLET | ORAL | Status: DC | PRN

## 2023-11-12 MED ORDER — CEFAZOLIN SODIUM-DEXTROSE 3-4 GM/150ML-% IV SOLN
INTRAVENOUS | Status: AC
Start: 2023-11-12 — End: 2023-11-12
  Filled 2023-11-12: qty 150

## 2023-11-12 MED ORDER — AMMONIA AROMATIC IN INHA
RESPIRATORY_TRACT | Status: AC
  Filled 2023-11-12: qty 10

## 2023-11-12 MED ORDER — WITCH HAZEL-GLYCERIN EX PADS: 1.0000 | MEDICATED_PAD | CUTANEOUS | Status: DC | PRN

## 2023-11-12 MED ORDER — SIMETHICONE 80 MG PO CHEW: 80.0000 mg | CHEWABLE_TABLET | ORAL | Status: DC | PRN

## 2023-11-12 NOTE — Anesthesia Postprocedure Evaluation (Signed)
 Anesthesia Post Note  Patient: Wendy Harrington  Procedure(s) Performed: AN AD HOC LABOR EPIDURAL     Patient location during evaluation: Mother Baby Anesthesia Type: Epidural Level of consciousness: awake and alert Pain management: pain level controlled Vital Signs Assessment: post-procedure vital signs reviewed and stable Respiratory status: spontaneous breathing, nonlabored ventilation and respiratory function stable Cardiovascular status: stable Postop Assessment: no headache, no backache and epidural receding Anesthetic complications: no   No notable events documented.  Last Vitals:  Vitals:   11/12/23 0655 11/12/23 1200  BP: 117/82 108/63  Pulse: 97   Resp:    Temp:    SpO2:      Last Pain:  Vitals:   11/12/23 1159  TempSrc:   PainSc: 0-No pain   Pain Goal:                   Quintessa Simmerman

## 2023-11-12 NOTE — Progress Notes (Addendum)
 Postpartum hemorrhage note:    Responded to Code Hemorrhage called by bedside RN. Dr. Erik in room evaluating patient. Per RN team, patient got up to void and had approx 700cc clots expelled. Was brought back to bed and given degree of VB, a Code Hemorrhage was called. Vital signs stable -- HR 90s-100s, BP normotensive. At bedside, Dr. Erik able to pull some clots out of the vagina, but exam initially limited by patient discomfort. A second bag of Pitocin  was hung and rectal Cytotec  administered. A second IV was established and labs drawn. After adequate analgesia with Fentanyl  150mcg, fundal sweep was performed w evacuation of several more moderate sized clots. A small trickle noted on repeat fundal rub. JADA then placed per manufacturer instructions. Ancef  ordered after sweep and JADA placement. Vitals stable on our departure.  Plan for JADA to remain in place x 1 hour then will eval bleeding when off suction. Repeat CBC in 6 hours.   Alain Sor, MD OB Fellow, Faculty Practice [facility], Center for [facility]

## 2023-11-12 NOTE — Significant Event (Signed)
 Pt called out to nurse to come to room. Nurse found patient standing in the bathroom in a pool of blood and many golf ball sized blood clots on the ground. Nurse called a code hemorrhage and brought patient back to the bed and did a fundal rub. Fudus boggy--after 2 minute massage--fudus became firm. No more clots expelled during rub. Patient stated she felt a little lightheaded before getting back into bed. Code hemorrhage taken care of by team. Patient cleaned up, comfortable in bed, and reminded not to get out of bed without help.

## 2023-11-12 NOTE — Discharge Summary (Signed)
 Postpartum Discharge Summary  Date of Service updated***     Patient Name: Wendy Harrington DOB: 2001/03/04 MRN: [id number]  Date of admission: 11/11/2023 Delivery date:11/12/2023 Delivering provider: VON REASONER Date of discharge: 11/12/2023  Admitting diagnosis: Normal labor [O80, Z37.9] Intrauterine pregnancy: [redacted]w[redacted]d     Secondary diagnosis:  Principal Problem:   SVD (spontaneous vaginal delivery) Active Problems:   Gestational hypertension   Shoulder dystocia during labor and delivery  Additional problems: BMI 69, depression/ADHD, gonorrhea    Discharge diagnosis: Term Pregnancy Delivered and Gestational Hypertension                                              Post partum procedures:{Postpartum procedures:23558} Augmentation: Pitocin  Complications: Shoulder dystocia, [facility]  Hospital course: Onset of Labor With Vaginal Delivery      23 y.o. yo G1P1001 at [redacted]w[redacted]d was admitted for spontaneous rupture of membranes on 11/11/2023. She was augmented with pitocin  but otherwise had an uncomplicated labor course. Delivery was complicated by a 30 second shoulder dystocia likely more consistent with soft tissue dystocia.  Membrane Rupture Time/Date: 4:00 AM,11/11/2023  Delivery Method:Vaginal, Spontaneous Operative Delivery:N/A Episiotomy: None Lacerations:  None Patient had a postpartum course complicated by ***.   - PPH on PPD0 - managed with pit, cytotec  & JADA She is ambulating, tolerating a regular diet, passing flatus, and urinating well. Patient is discharged home in stable condition on 11/12/23.  Newborn Data: Birth date:11/12/2023 Birth time:2:23 AM Gender:Female Living status:Living Apgars:7 ,9  Weight:2892 g  Magnesium Sulfate received: No*** BMZ received: No Rhophylac:N/A MMR:N/A T-DaP:Given prenatally Flu: N/A RSV Vaccine received: No Transfusion:{Transfusion received:30440034}  Immunizations received: Immunization History  Administered Date(s)  Administered   DTaP 12/28/2004, 02/07/2005, 04/25/2005, 10/24/2005, 08/29/2006   H1N1 01/11/2008, 02/08/2008   HIB (PRP-OMP) 12/28/2004   Hepatitis A 10/24/2005, 08/29/2006   Hepatitis B 03-Jan-2003, 02/07/2005, 04/25/2005   IPV 12/28/2004, 02/07/2005, 04/25/2005, 08/29/2006   Influenza Nasal 02/08/2008, 01/16/2009, 02/08/2010, 04/05/2011, 12/13/2011   Influenza Split 01/12/2004, 12/28/2004, 02/07/2005, 01/24/2006   Influenza, Seasonal, Injecte, Preservative Fre 05/08/2023   MMR 12/28/2004, 08/29/2006   Pneumococcal Conjugate-13 12/28/2004, 12/28/2004, 02/07/2005   Tdap 05/08/2023, 09/06/2023   Varicella 12/28/2004, 08/29/2006    Physical exam  Vitals:   11/12/23 0350 11/12/23 0400 11/12/23 0434 11/12/23 0544  BP: 125/61 133/73 113/79 113/64  Pulse: (!) 105 (!) 105 100 (!) 109  Resp:   18 18  Temp:   97.8 F (36.6 C) 98.6 F (37 C)  TempSrc:   Oral Oral  SpO2:   100% 100%  Weight:       General: {Exam; general:21111117} Lochia: {Desc; appropriate/inappropriate:30686::appropriate} Uterine Fundus: {Desc; firm/soft:30687} Incision: {Exam; incision:21111123} DVT Evaluation: {Exam; dvt:2111122} Labs: Lab Results  Component Value Date   WBC 20.2 (H) 11/12/2023   HGB 11.3 (L) 11/12/2023   HCT 33.3 (L) 11/12/2023   MCV 82.4 11/12/2023   PLT 315 11/12/2023      Latest Ref Rng & Units 11/11/2023    9:30 AM  CMP  Glucose 70 - 99 mg/dL 83   BUN 6 - 20 mg/dL 8   Creatinine 9.55 - 8.99 mg/dL 9.52   Sodium 864 - 854 mmol/L 136   Potassium 3.5 - 5.1 mmol/L 3.6   Chloride 98 - 111 mmol/L 107   CO2 22 - 32 mmol/L 22   Calcium 8.9 - 10.3 mg/dL 9.3  Total Protein 6.5 - 8.1 g/dL 7.3   Total Bilirubin 0.0 - 1.2 mg/dL 0.4   Alkaline Phos 38 - 126 U/L 92   AST 15 - 41 U/L 17   ALT 0 - 44 U/L 14    Edinburgh Score:     No data to display         No data recorded  After visit meds:  Allergies as of 11/12/2023       Reactions   Amoxapine And Related Hives   Penicillins  Nausea And Vomiting   Did it involve swelling of the face/tongue/throat, SOB, or low BP? Unknown Did it involve sudden or severe rash/hives, skin peeling, or any reaction on the inside of your mouth or nose? Unknown Did you need to seek medical attention at a hospital or doctor's office? Unknown When did it last happen?     PT was 4  If all above answers are "NO", may proceed with cephalosporin use.     Med Rec must be completed prior to using this [facility]***        Discharge home in stable condition Infant Feeding: Breast Infant Disposition:{CHL IP OB HOME WITH FNUYZM:76418} Discharge instruction: per After Visit Summary and Postpartum booklet. Activity: Advance as tolerated. Pelvic rest for 6 weeks.  Diet: routine diet Future Appointments: Future Appointments  Date Time Provider Department Center  11/13/2023 10:15 AM Tad, Arland POUR, CNM [facility]  11/13/2023  2:45 PM [facility] PROVIDER 1 [facility]  11/13/2023  3:00 PM [facility] US3 [facility]   Follow up Visit:  Message sent 8/25 by Erik  Please schedule this patient for a In person postpartum visit in 6 weeks with the following provider: Any provider. Additional Postpartum F/U:BP check 1 week  High risk pregnancy complicated by: BMI 60s, gHTN, depression Delivery mode:  Vaginal, Spontaneous Anticipated Birth Control:  Unsure   11/12/2023 Kieth JAYSON Erik, MD

## 2023-11-12 NOTE — Significant Event (Addendum)
 Event Note  PPD0 SVD with QBL 115 & no lacerations.  Called for code hemorrhage. On arrival, patient resting in bed, HDS with no apparent active bleeding. Clots weighed for 700cc. Total of 150mcg fentanyl  administered in stepwise fashion to facilitate uterine sweep. Uterus firm, ~100cc clot removed with sweep. Mild LUS atony noted. Pitocin  & PR misoprostol  administered. Persistent slow bleeding noted with LUS atony. JADA placed without complication - 100cc in vaginal balloon, set to of suction. Minimal blood in tubing.   Will finish pitocin  bolus then reassess JADA for removal.  Total EBL 1198cc Hgb immediately prior to episode was 11.3, will recheck in 6 hours or sooner prn Ancef  3g x 1 for ppx after uterine sweep  Kieth Carolin, MD Obstetrician & Gynecologist, [facility] for [employer], [facility]

## 2023-11-12 NOTE — Lactation Note (Signed)
 This note was copied from a baby's chart. Lactation Consultation Note  Patient Name: Wendy Harrington Unijb'd Date: 11/12/2023 Age:21 hours Reason for consult: Initial assessment;Primapara;Early term 49-38.6wks Dad holding baby STS d/t low temp. Mom stated baby BF well after delivery. Worked w/mom on hand expression. Mom excited to see colostrum. Newborn feeding habits, STS, I&O reviewed. Mom encouraged to feed baby 8-12 times/24 hours and with feeding cues.  Encouraged to call for assistance latching or questions. Mom doesn't have any at this time.  Maternal Data Has patient been taught Hand Expression?: Yes Does the patient have breastfeeding experience prior to this delivery?: No  Feeding    LATCH Score       Type of Nipple: Everted at rest and after stimulation  Comfort (Breast/Nipple): Soft / non-tender         Lactation Tools Discussed/Used    Interventions Interventions: Breast feeding basics reviewed;Skin to skin;Breast massage;Hand express;Education;LC Services brochure;CDC milk storage guidelines  Discharge    Consult Status Consult Status: Follow-up Date: 11/12/23 Follow-up type: In-patient    Ramona Slinger G 11/12/2023, 5:03 AM

## 2023-11-12 NOTE — Progress Notes (Signed)
   11/12/23 0700  Spiritual Encounters  Type of Visit Initial  Care provided to: Pt and family  Conversation partners present during encounter Nurse;Physician  Referral source Nurse (RN/NT/LPN)  Reason for visit Code  OnCall Visit Yes   Chaplain was paged to code hemorrhage. The patient stated that after giving birth, she sneezed and suddenly developed heavy bleeding. The medical team was present in the room. She was under good care by the medical team.   Chaplain provided emotional support for the patient and her partner. Chaplain remains available if needed.    M.Kubra Susanna Kerry Resident [phone]

## 2023-11-13 ENCOUNTER — Encounter (HOSPITAL_BASED_OUTPATIENT_CLINIC_OR_DEPARTMENT_OTHER): Payer: Medicaid Other | Admitting: Certified Nurse Midwife

## 2023-11-13 ENCOUNTER — Ambulatory Visit

## 2023-11-13 ENCOUNTER — Other Ambulatory Visit

## 2023-11-13 ENCOUNTER — Other Ambulatory Visit (HOSPITAL_COMMUNITY): Payer: Self-pay

## 2023-11-13 MED ORDER — ACETAMINOPHEN 500 MG PO TABS
1000.0000 mg | ORAL_TABLET | Freq: Three times a day (TID) | ORAL | 0 refills | Status: DC | PRN
  Filled 2023-11-13: qty 60, 10d supply, fill #0

## 2023-11-13 MED ORDER — POTASSIUM CHLORIDE CRYS ER 20 MEQ PO TBCR
20.0000 meq | EXTENDED_RELEASE_TABLET | Freq: Every day | ORAL | 0 refills | Status: DC
  Filled 2023-11-13: qty 4, 4d supply, fill #0

## 2023-11-13 MED ORDER — IBUPROFEN 800 MG PO TABS
800.0000 mg | ORAL_TABLET | Freq: Three times a day (TID) | ORAL | 0 refills | Status: DC | PRN
  Filled 2023-11-13: qty 60, 20d supply, fill #0

## 2023-11-13 MED ORDER — AMLODIPINE BESYLATE 5 MG PO TABS
5.0000 mg | ORAL_TABLET | Freq: Every day | ORAL | 0 refills | Status: DC
  Filled 2023-11-13: qty 90, 90d supply, fill #0

## 2023-11-13 MED ORDER — SENNA 8.6 MG PO TABS
2.0000 | ORAL_TABLET | Freq: Every evening | ORAL | 0 refills | Status: DC | PRN
  Filled 2023-11-13: qty 60, 30d supply, fill #0

## 2023-11-13 MED ORDER — FUROSEMIDE 20 MG PO TABS
20.0000 mg | ORAL_TABLET | Freq: Every day | ORAL | 0 refills | Status: DC
  Filled 2023-11-13: qty 4, 4d supply, fill #0

## 2023-11-13 NOTE — Patient Instructions (Signed)
 If interested in an outpatient lactation consult in office or virtually please reach out to us  at [facility] (First Floor) [address], [place] Please call [phone] and press 4 for lactation.   -- Lactation support groups:  [facility], Tuesdays 10:00 am -12:00 pm at [address] on the second floor in the conference room, lactating parents and lap babies welcome.  [email or web address]  [email or web address]    Wendy Harrington, [facility] for [facility]
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
## 2023-11-14 ENCOUNTER — Inpatient Hospital Stay (HOSPITAL_COMMUNITY): Admission: RE | Admit: 2023-11-14 | Source: Home / Self Care | Admitting: Family Medicine

## 2023-11-14 ENCOUNTER — Inpatient Hospital Stay (HOSPITAL_COMMUNITY)

## 2023-11-24 ENCOUNTER — Telehealth (HOSPITAL_COMMUNITY): Payer: Self-pay | Admitting: *Deleted

## 2023-11-24 NOTE — Telephone Encounter (Signed)
 Attempted hospital discharge follow-up phone call. Recording received: call unable to be completed. Allean IVAR Carton, RN, 11/24/23, [phone]

## 2023-12-24 ENCOUNTER — Ambulatory Visit (HOSPITAL_BASED_OUTPATIENT_CLINIC_OR_DEPARTMENT_OTHER): Payer: Self-pay | Admitting: Certified Nurse Midwife

## 2023-12-24 ENCOUNTER — Other Ambulatory Visit (HOSPITAL_COMMUNITY)
Admission: RE | Admit: 2023-12-24 | Discharge: 2023-12-24 | Disposition: A | Source: Ambulatory Visit | Attending: Certified Nurse Midwife | Admitting: Certified Nurse Midwife

## 2023-12-24 ENCOUNTER — Encounter (HOSPITAL_BASED_OUTPATIENT_CLINIC_OR_DEPARTMENT_OTHER): Payer: Self-pay | Admitting: Certified Nurse Midwife

## 2023-12-24 VITALS — BP 125/62 | HR 92 | Wt 377.8 lb

## 2023-12-24 DIAGNOSIS — Z124 Encounter for screening for malignant neoplasm of cervix: Secondary | ICD-10-CM

## 2023-12-24 DIAGNOSIS — Z113 Encounter for screening for infections with a predominantly sexual mode of transmission: Secondary | ICD-10-CM

## 2023-12-24 DIAGNOSIS — Z23 Encounter for immunization: Secondary | ICD-10-CM

## 2023-12-24 DIAGNOSIS — Z1332 Encounter for screening for maternal depression: Secondary | ICD-10-CM | POA: Diagnosis not present

## 2023-12-24 DIAGNOSIS — Z30011 Encounter for initial prescription of contraceptive pills: Secondary | ICD-10-CM | POA: Insufficient documentation

## 2023-12-24 MED ORDER — NORGESTIMATE-ETH ESTRADIOL 0.25-35 MG-MCG PO TABS: 1.0000 | ORAL_TABLET | Freq: Every day | ORAL | 11 refills | Status: AC

## 2023-12-24 NOTE — Addendum Note (Signed)
 Addended byBETHA TAD RACE on: 12/24/2023 09:40 AM   Modules accepted: Orders

## 2023-12-24 NOTE — Addendum Note (Signed)
 Addended by: Chestina Komatsu E on: 12/24/2023 10:42 AM   Modules accepted: Orders

## 2023-12-24 NOTE — Progress Notes (Signed)
 Subjective:     Wendy Harrington is a 23 y.o. female who presents for a postpartum visit. She is 6 weeks postpartum following a spontaneous vaginal delivery. I have fully reviewed the prenatal and intrapartum course. The delivery was at 38 gestational weeks. Outcome: spontaneous vaginal delivery. Anesthesia: epidural. Postpartum course has been uneventful. Baby's course has been uneventful. Baby is feeding by bottle-Enfarmil. Bleeding no bleeding. Bowel function is normal. Bladder function is normal. Patient will be  sexually active. Contraception method is OCP (estrogen/progesterone). .  The following portions of the patient's history were reviewed and updated as appropriate: allergies, current medications, past family history, past medical history, past social history, past surgical history, and problem list.  Review of Systems Pertinent items are noted in HPI.   Objective:    BP 125/62 (BP Location: Right Arm, Patient Position: Sitting, Cuff Size: Normal)   Pulse 92   Wt (!) 377 lb 12.8 oz (171.4 kg)   LMP 02/03/2023   SpO2 100%   Breastfeeding No   BMI 68.00 kg/m   General:  alert and cooperative   Breasts:    Lungs:   Heart:    Abdomen: soft, non-tender; bowel sounds normal; no masses,  no organomegaly   Vulva:  normal  Vagina: normal vagina, no discharge, exudate, lesion, or erythema  Cervix:  multiparous appearance, no bleeding following Pap, no cervical motion tenderness, and no lesions  Corpus: normal size, contour, position, consistency, mobility, non-tender  Adnexa:  no mass, fullness, tenderness  Rectal Exam: Not performed.        Assessment:    G1P1001 Term SVD of a viable female here for routine  postpartum exam. Pap smear done at today's visit.  Postpartum Depression Screen Negative  Plan:    1. Contraception: Pt aware of all available contraceptive options and prefers COC. Declines LARC, declines Mirena, declines other methods of contraception. Safe sex  encouraged. 2. Continue prenatal vitamins. Discussed pap smear for cervical cancer screening/pt agrees. 3. Follow up in: 1 year for annual gyn exam or as needed.   Arland MARLA Roller

## 2023-12-26 ENCOUNTER — Ambulatory Visit (HOSPITAL_BASED_OUTPATIENT_CLINIC_OR_DEPARTMENT_OTHER): Payer: Self-pay | Admitting: Certified Nurse Midwife

## 2023-12-26 LAB — CYTOLOGY - PAP
Adequacy: ABSENT
Chlamydia: NEGATIVE
Comment: NEGATIVE
Comment: NEGATIVE
Comment: NORMAL
Diagnosis: NEGATIVE
Neisseria Gonorrhea: NEGATIVE
Trichomonas: NEGATIVE
